# Patient Record
Sex: Male | Born: 1995 | Race: Black or African American | Hispanic: No | Marital: Single | Smoking: Current every day smoker
Health system: Southern US, Community
[De-identification: ages and names within clinical notes are randomized; demographics above are authoritative.]

---
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
## 2018-03-16 ENCOUNTER — Emergency Department (HOSPITAL_COMMUNITY)
Admission: EM | Admit: 2018-03-16 | Discharge: 2018-03-16 | Disposition: A | Discharge status: Home / Self Care | Payer: Self-pay | Attending: Emergency Medicine | Admitting: Emergency Medicine

## 2018-03-16 ENCOUNTER — Encounter (HOSPITAL_COMMUNITY): Payer: Self-pay

## 2018-03-16 ENCOUNTER — Other Ambulatory Visit: Payer: Self-pay

## 2018-03-16 DIAGNOSIS — X500XXA Overexertion from strenuous movement or load, initial encounter: Secondary | ICD-10-CM | POA: Insufficient documentation

## 2018-03-16 DIAGNOSIS — Y9389 Activity, other specified: Secondary | ICD-10-CM | POA: Insufficient documentation

## 2018-03-16 DIAGNOSIS — S29019A Strain of muscle and tendon of unspecified wall of thorax, initial encounter: Secondary | ICD-10-CM | POA: Insufficient documentation

## 2018-03-16 DIAGNOSIS — F17210 Nicotine dependence, cigarettes, uncomplicated: Secondary | ICD-10-CM | POA: Insufficient documentation

## 2018-03-16 DIAGNOSIS — Y929 Unspecified place or not applicable: Secondary | ICD-10-CM | POA: Insufficient documentation

## 2018-03-16 DIAGNOSIS — Y999 Unspecified external cause status: Secondary | ICD-10-CM | POA: Insufficient documentation

## 2018-03-16 MED ORDER — CYCLOBENZAPRINE HCL 10 MG PO TABS: 10.0000 mg | ORAL_TABLET | Freq: Two times a day (BID) | ORAL | 0 refills | Status: DC | PRN

## 2018-03-16 MED ORDER — NAPROXEN 500 MG PO TABS: 500.0000 mg | ORAL_TABLET | Freq: Two times a day (BID) | ORAL | 0 refills | Status: DC

## 2018-03-16 NOTE — ED Provider Notes (Signed)
[facility]-EMERGENCY DEPT Provider Note   CSN: [id number] Arrival date & time: 03/16/18  1540     History   Chief Complaint Chief Complaint  Patient presents with  . Back Pain    HPI Louis Carson is a 23 y.o. male who presents to the ED with back pain. Patient reports the pain is in the left thoracic area and started 2 days ago. Patient reports lifting heavy furniture and the pain started after that. No loss of control of bladder or bowels.   The history is provided by the patient. No language interpreter was used.  Back Pain  Location:  Thoracic spine Quality:  Aching Radiates to:  Does not radiate Onset quality:  Sudden Duration:  2 days Timing:  Constant Chronicity:  New Context: lifting heavy objects   Relieved by:  None tried Worsened by:  Twisting and movement Ineffective treatments:  None tried Associated symptoms: no abdominal pain, no bladder incontinence, no bowel incontinence, no dysuria, no fever, no headaches, no leg pain and no weakness     History reviewed. No pertinent past medical history.  There are no active problems to display for this patient.   History reviewed. No pertinent surgical history.      Home Medications    Prior to Admission medications   Medication Sig Start Date End Date Taking? Authorizing Provider  cyclobenzaprine (FLEXERIL) 10 MG tablet Take 1 tablet (10 mg total) by mouth 2 (two) times daily as needed for muscle spasms. 03/16/18   Janne NapoleonNeese, Armel Rabbani M, NP  naproxen (NAPROSYN) 500 MG tablet Take 1 tablet (500 mg total) by mouth 2 (two) times daily. 03/16/18   Janne NapoleonNeese, Vondra Aldredge M, NP    Family History Family History  Problem Relation Age of Onset  . Healthy Mother   . Healthy Father     Social History Social History   Tobacco Use  . Smoking status: Current Every Day Smoker    Types: Cigars  . Smokeless tobacco: Never Used  Substance Use Topics  . Alcohol use: Never    Frequency: Never  . Drug use:  Never     Allergies   Patient has no known allergies.   Review of Systems Review of Systems  Constitutional: Negative for chills and fever.  HENT: Negative.   Eyes: Negative for visual disturbance.  Respiratory: Negative for shortness of breath.   Gastrointestinal: Negative for abdominal pain, bowel incontinence, nausea and vomiting.  Genitourinary: Negative for bladder incontinence and dysuria.  Musculoskeletal: Positive for back pain.  Skin: Negative for rash.  Neurological: Negative for weakness and headaches.  Psychiatric/Behavioral: Negative for confusion.     Physical Exam Updated Vital Signs BP 131/77 (BP Location: Right Arm)   Pulse 68   Temp 97.7 F (36.5 C) (Oral)   Resp 16   Ht 5\' 8"  (1.727 m)   Wt 61.2 kg   SpO2 100%   BMI 20.53 kg/m   Physical Exam Vitals signs and nursing note reviewed.  Constitutional:      General: He is not in acute distress.    Appearance: He is well-developed.  HENT:     Head: Normocephalic and atraumatic.     Nose: Nose normal.     Mouth/Throat:     Mouth: Mucous membranes are moist.  Eyes:     Conjunctiva/sclera: Conjunctivae normal.  Neck:     Musculoskeletal: Neck supple.  Cardiovascular:     Rate and Rhythm: Normal rate.  Pulmonary:     Effort:  Pulmonary effort is normal.  Musculoskeletal:     Thoracic back: He exhibits tenderness and spasm. He exhibits normal pulse. Decreased range of motion: due to pain.       Back:  Skin:    General: Skin is warm and dry.  Neurological:     Mental Status: He is alert and oriented to person, place, and time.     Motor: Motor function is intact.     Deep Tendon Reflexes:     Reflex Scores:      Bicep reflexes are 2+ on the right side and 2+ on the left side.      Brachioradialis reflexes are 2+ on the right side and 2+ on the left side.      Patellar reflexes are 2+ on the right side and 2+ on the left side.    Comments: Grips are equal. Straight leg raises without  difficulty.  Psychiatric:        Mood and Affect: Mood normal.      ED Treatments / Results  Labs (all labs ordered are listed, but only abnormal results are displayed) Labs Reviewed - No data to display  Radiology No results found.  Procedures Procedures (including critical care time)  Medications Ordered in ED Medications - No data to display   Initial Impression / Assessment and Plan / ED Course  I have reviewed the triage vital signs and the nursing notes. Patient with back pain.  No neurological deficits and normal neuro exam.  Patient can walk but states is painful.  No loss of bowel or bladder control.  No concern for cauda equina.  No fever, night sweats, weight loss, h/o cancer, IVDU.  RICE protocol and pain medicine indicated and discussed with patient.   Final Clinical Impressions(s) / ED Diagnoses   Final diagnoses:  Thoracic myofascial strain, initial encounter    ED Discharge Orders         Ordered    cyclobenzaprine (FLEXERIL) 10 MG tablet  2 times daily PRN     03/16/18 1755    naproxen (NAPROSYN) 500 MG tablet  2 times daily     03/16/18 1755           Kerrie Buffalo [place], [place] 03/16/18 2127    Linwood Dibbles, MD 03/18/18 1319

## 2018-03-16 NOTE — Discharge Instructions (Addendum)
Do not take the muscle relaxer if driving or working as it will make you sleepy. Follow up with your doctor or return here as needed.

## 2018-03-16 NOTE — ED Notes (Signed)
Pt called X2 for triage with no responce

## 2018-03-16 NOTE — ED Triage Notes (Signed)
Patient c/o left mid back pain x 2 days. Patient states he has been lifting furniture and noted that the pain started after that.

## 2018-04-10 ENCOUNTER — Other Ambulatory Visit: Payer: Self-pay

## 2018-04-10 ENCOUNTER — Emergency Department (HOSPITAL_COMMUNITY)
Admission: EM | Admit: 2018-04-10 | Discharge: 2018-04-10 | Disposition: A | Discharge status: Home / Self Care | Payer: Self-pay | Attending: Emergency Medicine | Admitting: Emergency Medicine

## 2018-04-10 ENCOUNTER — Encounter (HOSPITAL_COMMUNITY): Payer: Self-pay | Admitting: Emergency Medicine

## 2018-04-10 DIAGNOSIS — J40 Bronchitis, not specified as acute or chronic: Secondary | ICD-10-CM | POA: Insufficient documentation

## 2018-04-10 DIAGNOSIS — F17290 Nicotine dependence, other tobacco product, uncomplicated: Secondary | ICD-10-CM | POA: Insufficient documentation

## 2018-04-10 DIAGNOSIS — R0981 Nasal congestion: Secondary | ICD-10-CM | POA: Insufficient documentation

## 2018-04-10 MED ORDER — AMOXICILLIN 500 MG PO CAPS: 500.0000 mg | ORAL_CAPSULE | Freq: Three times a day (TID) | ORAL | 0 refills | Status: DC

## 2018-04-10 NOTE — ED Triage Notes (Signed)
Pt reports for 2 weeks having cough, congestion and pains when coughs. Reports had family sick.

## 2018-04-10 NOTE — Discharge Instructions (Addendum)
Return if any problems.

## 2018-04-10 NOTE — ED Provider Notes (Signed)
[facility]-EMERGENCY DEPT Provider Note   CSN: [id number] Arrival date & time: 04/10/18  1555     History   Chief Complaint Chief Complaint  Patient presents with  . Cough  . Nasal Congestion    HPI Louis Carson is a 23 y.o. male.  The history is provided by the patient. No language interpreter was used.  Cough  Cough characteristics:  Productive Sputum characteristics:  Nondescript Severity:  Moderate Onset quality:  Gradual Duration:  2 weeks Timing:  Constant Progression:  Worsening Chronicity:  New Smoker: no   Relieved by:  Nothing Worsened by:  Nothing Ineffective treatments:  None tried Associated symptoms: sinus congestion     History reviewed. No pertinent past medical history.  There are no active problems to display for this patient.   History reviewed. No pertinent surgical history.      Home Medications    Prior to Admission medications   Medication Sig Start Date End Date Taking? Authorizing Provider  amoxicillin (AMOXIL) 500 MG capsule Take 1 capsule (500 mg total) by mouth 3 (three) times daily. 04/10/18   Elson Areas, PA-C  cyclobenzaprine (FLEXERIL) 10 MG tablet Take 1 tablet (10 mg total) by mouth 2 (two) times daily as needed for muscle spasms. 03/16/18   Janne Napoleon, NP  naproxen (NAPROSYN) 500 MG tablet Take 1 tablet (500 mg total) by mouth 2 (two) times daily. 03/16/18   Janne Napoleon, NP    Family History Family History  Problem Relation Age of Onset  . Healthy Mother   . Healthy Father     Social History Social History   Tobacco Use  . Smoking status: Current Every Day Smoker    Types: Cigars  . Smokeless tobacco: Never Used  Substance Use Topics  . Alcohol use: Never    Frequency: Never  . Drug use: Never     Allergies   Patient has no known allergies.   Review of Systems Review of Systems  Respiratory: Positive for cough.   All other systems reviewed and are  negative.    Physical Exam Updated Vital Signs BP 111/71 (BP Location: Right Arm)   Pulse 68   Temp 98.2 F (36.8 C) (Oral)   Resp 18   SpO2 99%   Physical Exam Vitals signs and nursing note reviewed.  Constitutional:      Appearance: He is well-developed.  HENT:     Head: Normocephalic.     Nose: Nose normal.     Mouth/Throat:     Mouth: Mucous membranes are moist.  Eyes:     Pupils: Pupils are equal, round, and reactive to light.  Neck:     Musculoskeletal: Normal range of motion.  Cardiovascular:     Rate and Rhythm: Normal rate.  Pulmonary:     Effort: Pulmonary effort is normal.  Abdominal:     General: Abdomen is flat. There is no distension.  Musculoskeletal: Normal range of motion.  Skin:    General: Skin is warm.     Capillary Refill: Capillary refill takes less than 2 seconds.  Neurological:     General: No focal deficit present.     Mental Status: He is alert and oriented to person, place, and time.  Psychiatric:        Mood and Affect: Mood normal.      ED Treatments / Results  Labs (all labs ordered are listed, but only abnormal results are displayed) Labs Reviewed - No data  to display  EKG None  Radiology No results found.  Procedures Procedures (including critical care time)  Medications Ordered in ED Medications - No data to display   Initial Impression / Assessment and Plan / ED Course  I have reviewed the triage vital signs and the nursing notes.  Pertinent labs & imaging results that were available during my care of the patient were reviewed by me and considered in my medical decision making (see chart for details).     MDM  Pt has had cough for over 2 weeks.  I will treat for bronchitis.  Final Clinical Impressions(s) / ED Diagnoses   Final diagnoses:  Bronchitis    ED Discharge Orders         Ordered    amoxicillin (AMOXIL) 500 MG capsule  3 times daily     04/10/18 1634        An After Visit Summary was printed  and given to the patient.    Osie Cheeks 04/10/18 2137    Linwood Dibbles, MD 04/10/18 [phone]

## 2018-04-18 ENCOUNTER — Emergency Department (HOSPITAL_COMMUNITY)
Admission: EM | Admit: 2018-04-18 | Discharge: 2018-04-18 | Disposition: A | Discharge status: Home / Self Care | Payer: Self-pay | Attending: Emergency Medicine | Admitting: Emergency Medicine

## 2018-04-18 ENCOUNTER — Other Ambulatory Visit: Payer: Self-pay

## 2018-04-18 ENCOUNTER — Encounter (HOSPITAL_COMMUNITY): Payer: Self-pay | Admitting: Emergency Medicine

## 2018-04-18 DIAGNOSIS — R1013 Epigastric pain: Secondary | ICD-10-CM | POA: Insufficient documentation

## 2018-04-18 DIAGNOSIS — Z79899 Other long term (current) drug therapy: Secondary | ICD-10-CM | POA: Insufficient documentation

## 2018-04-18 DIAGNOSIS — K2970 Gastritis, unspecified, without bleeding: Secondary | ICD-10-CM | POA: Insufficient documentation

## 2018-04-18 DIAGNOSIS — R112 Nausea with vomiting, unspecified: Secondary | ICD-10-CM

## 2018-04-18 DIAGNOSIS — F17210 Nicotine dependence, cigarettes, uncomplicated: Secondary | ICD-10-CM | POA: Insufficient documentation

## 2018-04-18 LAB — COMPREHENSIVE METABOLIC PANEL
ALT: 18 U/L (ref 0–44)
AST: 20 U/L (ref 15–41)
Albumin: 4.9 g/dL (ref 3.5–5.0)
Alkaline Phosphatase: 73 U/L (ref 38–126)
Anion gap: 6 (ref 5–15)
BUN: 15 mg/dL (ref 6–20)
CHLORIDE: 104 mmol/L (ref 98–111)
CO2: 28 mmol/L (ref 22–32)
Calcium: 9.4 mg/dL (ref 8.9–10.3)
Creatinine, Ser: 0.94 mg/dL (ref 0.61–1.24)
GFR calc Af Amer: >60 mL/min (ref >60)
GFR calc non Af Amer: >60 mL/min (ref >60)
Glucose, Bld: 81 mg/dL (ref 70–99)
POTASSIUM: 3.9 mmol/L (ref 3.5–5.1)
Sodium: 138 mmol/L (ref 135–145)
Total Bilirubin: 1.5 mg/dL — ABNORMAL HIGH (ref 0.3–1.2)
Total Protein: 7.8 g/dL (ref 6.5–8.1)

## 2018-04-18 LAB — CBC
HCT: 47 % (ref 39.0–52.0)
HEMOGLOBIN: 15.1 g/dL (ref 13.0–17.0)
MCH: 30.3 pg (ref 26.0–34.0)
MCHC: 32.1 g/dL (ref 30.0–36.0)
MCV: 94.4 fL (ref 80.0–100.0)
Platelets: 248 10*3/uL (ref 150–400)
RBC: 4.98 MIL/uL (ref 4.22–5.81)
RDW: 11.8 % (ref 11.5–15.5)
WBC: 4.8 10*3/uL (ref 4.0–10.5)
nRBC: 0 % (ref 0.0–0.2)

## 2018-04-18 LAB — LIPASE, BLOOD: Lipase: 45 U/L (ref 11–51)

## 2018-04-18 MED ORDER — ALUM & MAG HYDROXIDE-SIMETH 200-200-20 MG/5ML PO SUSP
30.0000 mL | Freq: Once | ORAL | Status: AC
  Administered 2018-04-18: 30 mL via ORAL
  Filled 2018-04-18: qty 30

## 2018-04-18 MED ORDER — RANITIDINE HCL 150 MG PO TABS: 150.0000 mg | ORAL_TABLET | Freq: Two times a day (BID) | ORAL | 0 refills | Status: AC

## 2018-04-18 MED ORDER — ONDANSETRON HCL 4 MG/2ML IJ SOLN
4.0000 mg | Freq: Once | INTRAMUSCULAR | Status: AC
  Administered 2018-04-18: 4 mg via INTRAVENOUS
  Filled 2018-04-18: qty 2

## 2018-04-18 MED ORDER — ONDANSETRON 4 MG PO TBDP: 4.0000 mg | ORAL_TABLET | Freq: Three times a day (TID) | ORAL | 0 refills | Status: DC | PRN

## 2018-04-18 MED ORDER — SODIUM CHLORIDE 0.9 % IV BOLUS
1000.0000 mL | Freq: Once | INTRAVENOUS | Status: AC
  Administered 2018-04-18: 1000 mL via INTRAVENOUS

## 2018-04-18 MED ORDER — LIDOCAINE VISCOUS HCL 2 % MT SOLN
15.0000 mL | Freq: Once | OROMUCOSAL | Status: AC
  Administered 2018-04-18: 15 mL via ORAL
  Filled 2018-04-18: qty 15

## 2018-04-18 MED ORDER — FAMOTIDINE IN NACL 20-0.9 MG/50ML-% IV SOLN
20.0000 mg | Freq: Once | INTRAVENOUS | Status: AC
  Administered 2018-04-18: 20 mg via INTRAVENOUS
  Filled 2018-04-18: qty 50

## 2018-04-18 NOTE — Discharge Instructions (Signed)
Your abdominal pain is likely from gastritis or an ulcer, probably from what you ate yesterday. You will need to take zantac as directed, and avoid spicy/fatty/acidic foods, avoid soda/coffee/tea/alcohol. Avoid laying down flat within 30 minutes of eating. Avoid NSAIDs like ibuprofen/aleve/motrin/etc on an empty stomach. May consider using over the counter tums/maalox as needed for additional relief. Use zofran as directed as needed for nausea. Use tylenol as needed for pain. Follow up with your regular doctor in 5-7 days for recheck of symptoms. Return to the ER for changes or worsening symptoms.  Abdominal (belly) pain can be caused by many things. Your caregiver performed an examination and possibly ordered blood/urine tests and imaging (CT scan, x-rays, ultrasound). Many cases can be observed and treated at home after initial evaluation in the emergency department. Even though you are being discharged home, abdominal pain can be unpredictable. Therefore, you need a repeated exam if your pain does not resolve, returns, or worsens. Most patients with abdominal pain don't have to be admitted to the hospital or have surgery, but serious problems like appendicitis and gallbladder attacks can start out as nonspecific pain. Many abdominal conditions cannot be diagnosed in one visit, so follow-up evaluations are very important. SEEK IMMEDIATE MEDICAL ATTENTION IF YOU DEVELOP ANY OF THE FOLLOWING SYMPTOMS: The pain does not go away or becomes severe.  A temperature above 101 develops.  Repeated vomiting occurs (multiple episodes).  The pain becomes localized to portions of the abdomen. The right side could possibly be appendicitis. In an adult, the left lower portion of the abdomen could be colitis or diverticulitis.  Blood is being passed in stools or vomit (bright red or black tarry stools).  Return also if you develop chest pain, difficulty breathing, dizziness or fainting, or become confused, poorly  responsive, or inconsolable (young children). The constipation stays for more than 4 days.  There is belly (abdominal) or rectal pain.  You do not seem to be getting better.

## 2018-04-18 NOTE — ED Triage Notes (Signed)
Pt reports this morning started having abd pains with n/v and had to leave work. Denies pain at this time or issues with bowels or urination.

## 2018-04-18 NOTE — ED Notes (Signed)
Pt refused blood draw

## 2018-04-18 NOTE — ED Provider Notes (Signed)
[facility]-EMERGENCY DEPT Provider Note   CSN: [id number] Arrival date & time: 04/18/18  1531    History   Chief Complaint Chief Complaint  Patient presents with  . Abdominal Pain  . vomit    HPI    Louis Carson is a 23 y.o. otherwise healthy male who presents to the ED with complaints of abdominal pain that began around 3 AM that has since resolved with associated nausea and vomiting.  Patient states that he ate [place] food last night before bed for the first time ever, reports that it was spicy.  Around 3 AM he woke up with some epigastric abdominal pain that he describes as 6/10 intermittent sharp nonradiating epigastric pain that had no known aggravating or alleviating factors, no treatments were tried.  He woke up around 7 AM and still had some mild pain and then vomited, the pain went away after that but he vomited once more, and while at work he did not feel well so he decided to come in for evaluation.  He had a total of 2 episodes of nonbloody nonbilious emesis.  He drank alcohol 2 days ago but none yesterday. He reports that he enjoys spicy food regularly.  He denies any fevers, chills, chest pain, shortness of breath, hematemesis, diarrhea, constipation, melena, hematochezia, dysuria, hematuria, myalgias, arthralgias, numbness, tingling, focal weakness, or any other complaints at this time.  He denies any recent travel, sick contacts, NSAID use, or prior abdominal surgeries.  He denies having any medical problems.  The history is provided by the patient and medical records. No language interpreter was used.    History reviewed. No pertinent past medical history.  There are no active problems to display for this patient.   History reviewed. No pertinent surgical history.      Home Medications    Prior to Admission medications   Medication Sig Start Date End Date Taking? Authorizing Provider  amoxicillin (AMOXIL) 500 MG capsule Take 1 capsule  (500 mg total) by mouth 3 (three) times daily. 04/10/18   Elson Areas, PA-C  cyclobenzaprine (FLEXERIL) 10 MG tablet Take 1 tablet (10 mg total) by mouth 2 (two) times daily as needed for muscle spasms. 03/16/18   Janne Napoleon, NP  naproxen (NAPROSYN) 500 MG tablet Take 1 tablet (500 mg total) by mouth 2 (two) times daily. 03/16/18   Janne Napoleon, NP    Family History Family History  Problem Relation Age of Onset  . Healthy Mother   . Healthy Father     Social History Social History   Tobacco Use  . Smoking status: Current Every Day Smoker    Types: Cigars  . Smokeless tobacco: Never Used  Substance Use Topics  . Alcohol use: Never    Frequency: Never  . Drug use: Never     Allergies   Patient has no known allergies.   Review of Systems Review of Systems  Constitutional: Negative for chills and fever.  Respiratory: Negative for shortness of breath.   Cardiovascular: Negative for chest pain.  Gastrointestinal: Positive for abdominal pain, nausea and vomiting. Negative for blood in stool, constipation and diarrhea.  Genitourinary: Negative for dysuria and hematuria.  Musculoskeletal: Negative for arthralgias and myalgias.  Skin: Negative for color change.  Allergic/Immunologic: Negative for immunocompromised state.  Neurological: Negative for weakness and numbness.  Psychiatric/Behavioral: Negative for confusion.   All other systems reviewed and are negative for acute change except as noted in the HPI.  Physical Exam Updated Vital Signs BP 121/81 (BP Location: Left Arm)   Pulse 82   Temp 98.7 F (37.1 C) (Oral)   Resp 18   SpO2 100%   Physical Exam Vitals signs and nursing note reviewed.  Constitutional:      General: He is not in acute distress.    Appearance: Normal appearance. He is well-developed. He is not toxic-appearing.     Comments: Afebrile, nontoxic, NAD  HENT:     Head: Normocephalic and atraumatic.  Eyes:     General:        Right eye:  No discharge.        Left eye: No discharge.     Conjunctiva/sclera: Conjunctivae normal.  Neck:     Musculoskeletal: Normal range of motion and neck supple.  Cardiovascular:     Rate and Rhythm: Normal rate and regular rhythm.     Pulses: Normal pulses.     Heart sounds: Normal heart sounds, S1 normal and S2 normal. No murmur. No friction rub. No gallop.   Pulmonary:     Effort: Pulmonary effort is normal. No respiratory distress.     Breath sounds: Normal breath sounds. No decreased breath sounds, wheezing, rhonchi or rales.  Abdominal:     General: Bowel sounds are normal. There is no distension.     Palpations: Abdomen is soft. Abdomen is not rigid.     Tenderness: There is abdominal tenderness in the epigastric area. There is no right CVA tenderness, left CVA tenderness, guarding or rebound. Negative signs include Murphy's sign and McBurney's sign.     Comments: Soft, nondistended, +BS throughout, with very mild epigastric TTP, no r/g/r, neg murphy's, neg mcburney's, no CVA TTP   Musculoskeletal: Normal range of motion.  Skin:    General: Skin is warm and dry.     Findings: No rash.  Neurological:     Mental Status: He is alert and oriented to person, place, and time.     Sensory: Sensation is intact. No sensory deficit.     Motor: Motor function is intact.  Psychiatric:        Mood and Affect: Mood and affect normal.        Behavior: Behavior normal.      ED Treatments / Results  Labs (all labs ordered are listed, but only abnormal results are displayed) Labs Reviewed  COMPREHENSIVE METABOLIC PANEL - Abnormal; Notable for the following components:      Result Value   Total Bilirubin 1.5 (*)    All other components within normal limits  LIPASE, BLOOD  CBC    EKG None  Radiology No results found.  Procedures Procedures (including critical care time)  Medications Ordered in ED Medications  sodium chloride 0.9 % bolus 1,000 mL (0 mLs Intravenous Stopped  04/18/18 1819)  ondansetron (ZOFRAN) injection 4 mg (4 mg Intravenous Given 04/18/18 1724)  famotidine (PEPCID) IVPB 20 mg premix (0 mg Intravenous Stopped 04/18/18 1754)  alum & mag hydroxide-simeth (MAALOX/MYLANTA) 200-200-20 MG/5ML suspension 30 mL (30 mLs Oral Given 04/18/18 1716)    And  lidocaine (XYLOCAINE) 2 % viscous mouth solution 15 mL (15 mLs Oral Given 04/18/18 1716)     Initial Impression / Assessment and Plan / ED Course  I have reviewed the triage vital signs and the nursing notes.  Pertinent labs & imaging results that were available during my care of the patient were reviewed by me and considered in my medical decision making (see chart for details).  23 y.o. male here with upper abdominal pain that began around 3 AM but has since resolved as well as nausea and vomiting.  He ate [place] food for the first time last night and states it was very spicy.  On exam, very mild midepigastric abdominal tenderness with no rebound or guarding, no rigidity, negative Murphy sign, no other areas of tenderness.  Overall well-appearing.  Will get labs but doubt need for imaging or urinalysis (no UTI symptoms or lower abd pain), will give fluids, Zofran, Pepcid, GI cocktail, and reassess shortly.   7:14 PM CBC WNL. CMP with marginally elevated Tbili 1.5 but otherwise unremarkable, no other LFT abnormalities, likely from mild dehydration/vomiting, doubt need for further emergent work up. Lipase WNL. Pt feeling better and tolerating PO well. Suspect gastritis from [place] food last night. Discussed diet/lifestyle modifications for symptoms, will start on zantac/zofran, advised tylenol and avoidance/sparing use of NSAIDs only on full stomach, discussed other OTC remedies for symptomatic relief, and f/up with PCP in 5-7 days for recheck of symptoms and ongoing evaluation/management. I explained the diagnosis and have given explicit precautions to return to the ER including for any other new or  worsening symptoms. The patient understands and accepts the medical plan as it's been dictated and I have answered their questions. Discharge instructions concerning home care and prescriptions have been given. The patient is STABLE and is discharged to home in good condition.    Final Clinical Impressions(s) / ED Diagnoses   Final diagnoses:  Epigastric abdominal pain  Nausea and vomiting in adult patient  Gastritis, presence of bleeding unspecified, unspecified chronicity, unspecified gastritis type    ED Discharge Orders         Ordered    ondansetron (ZOFRAN ODT) 4 MG disintegrating tablet  Every 8 hours PRN     04/18/18 1913    ranitidine (ZANTAC) 150 MG tablet  2 times daily     04/18/18 [address] [place], [place] 04/18/18 1914    Shaune Pollack, MD 04/22/18 0002

## 2018-06-21 ENCOUNTER — Encounter (HOSPITAL_COMMUNITY): Payer: Self-pay

## 2018-06-21 ENCOUNTER — Emergency Department (HOSPITAL_COMMUNITY)
Admission: EM | Admit: 2018-06-21 | Discharge: 2018-06-21 | Disposition: A | Discharge status: Home / Self Care | Payer: Self-pay | Attending: Emergency Medicine | Admitting: Emergency Medicine

## 2018-06-21 ENCOUNTER — Other Ambulatory Visit: Payer: Self-pay

## 2018-06-21 DIAGNOSIS — J3489 Other specified disorders of nose and nasal sinuses: Secondary | ICD-10-CM | POA: Insufficient documentation

## 2018-06-21 DIAGNOSIS — Z7189 Other specified counseling: Secondary | ICD-10-CM | POA: Insufficient documentation

## 2018-06-21 DIAGNOSIS — R509 Fever, unspecified: Secondary | ICD-10-CM | POA: Insufficient documentation

## 2018-06-21 DIAGNOSIS — R5383 Other fatigue: Secondary | ICD-10-CM | POA: Insufficient documentation

## 2018-06-21 DIAGNOSIS — Z79899 Other long term (current) drug therapy: Secondary | ICD-10-CM | POA: Insufficient documentation

## 2018-06-21 DIAGNOSIS — F17290 Nicotine dependence, other tobacco product, uncomplicated: Secondary | ICD-10-CM | POA: Insufficient documentation

## 2018-06-21 NOTE — ED Triage Notes (Signed)
States works at [employer] and lot of people have tested positive for Covid 19 and now states tired, and possible fever he has not tested for fever. And runny nose.

## 2018-06-21 NOTE — ED Provider Notes (Signed)
[facility]-EMERGENCY DEPT Provider Note   CSN: [id number] Arrival date & time: 06/21/18  1532    History   Chief Complaint Chief Complaint  Patient presents with  . Influenza    HPI    Louis Carson is a 23 y.o. male who presents to the ED with complaints of 1 day of fatigue, subjective fever, and rhinorrhea that started while he was at work.  No known aggravating or alleviating factors, no treatments tried prior to arrival.  Symptoms are mild and intermittent.  Several people have tested positive for COVID-19 at work.  He is a non-smoker.  He has not checked his temperature, his temperature is checked at work, but he did not start feeling bad until after he left work yesterday.  He denies any sore throat, cough, ear pain or drainage, loss of taste or smell, chills, chest pain, shortness breath, abdominal pain, nausea, vomiting, diarrhea, constipation, dysuria, hematuria, all disc arthralgias, numbness, tingling, focal weakness, or any other complaints at this time.  The history is provided by the patient and medical records. No language interpreter was used.  Influenza  Presenting symptoms: fatigue, fever (subjective) and rhinorrhea   Presenting symptoms: no cough, no diarrhea, no myalgias, no nausea, no shortness of breath, no sore throat and no vomiting   Associated symptoms: no chills and no ear pain     History reviewed. No pertinent past medical history.  There are no active problems to display for this patient.   History reviewed. No pertinent surgical history.      Home Medications    Prior to Admission medications   Medication Sig Start Date End Date Taking? Authorizing Provider  amoxicillin (AMOXIL) 500 MG capsule Take 1 capsule (500 mg total) by mouth 3 (three) times daily. Patient not taking: Reported on 04/18/2018 04/10/18   Elson Areas, PA-C  cyclobenzaprine (FLEXERIL) 10 MG tablet Take 1 tablet (10 mg total) by mouth 2 (two) times  daily as needed for muscle spasms. Patient not taking: Reported on 04/18/2018 03/16/18   Janne Napoleon, NP  DM-Phenylephrine-Acetaminophen [facility]) 10-5-325 MG/15ML LIQD Take 15 mLs by mouth every 6 (six) hours as needed (cough, headache, and congestion).    [provider]  naproxen (NAPROSYN) 500 MG tablet Take 1 tablet (500 mg total) by mouth 2 (two) times daily. Patient not taking: Reported on 04/18/2018 03/16/18   Janne Napoleon, NP  ondansetron (ZOFRAN ODT) 4 MG disintegrating tablet Take 1 tablet (4 mg total) by mouth every 8 (eight) hours as needed for nausea or vomiting. 04/18/18   Ahilyn Nell, [place], PA-C  ranitidine (ZANTAC) 150 MG tablet Take 1 tablet (150 mg total) by mouth 2 (two) times daily. 04/18/18   Jaquarius Seder, [place], PA-C    Family History Family History  Problem Relation Age of Onset  . Healthy Mother   . Healthy Father     Social History Social History   Tobacco Use  . Smoking status: Current Every Day Smoker    Types: Cigars  . Smokeless tobacco: Never Used  Substance Use Topics  . Alcohol use: Never    Frequency: Never  . Drug use: Never     Allergies   Patient has no known allergies.   Review of Systems Review of Systems  Constitutional: Positive for fatigue and fever (subjective). Negative for chills.  HENT: Positive for rhinorrhea. Negative for ear discharge, ear pain and sore throat.   Respiratory: Negative for cough and shortness of breath.  Cardiovascular: Negative for chest pain.  Gastrointestinal: Negative for abdominal pain, constipation, diarrhea, nausea and vomiting.  Genitourinary: Negative for dysuria and hematuria.  Musculoskeletal: Negative for arthralgias and myalgias.  Skin: Negative for color change.  Allergic/Immunologic: Negative for immunocompromised state.  Neurological: Negative for weakness and numbness.  Psychiatric/Behavioral: Negative for confusion.   All other systems reviewed and are negative for acute  change except as noted in the HPI.    Physical Exam Updated Vital Signs BP 131/79 (BP Location: Right Arm)   Pulse 70   Temp 98.2 F (36.8 C) (Oral)   Resp 18   Ht 5\' 8"  (1.727 m)   Wt 61.2 kg   SpO2 100%   BMI 20.53 kg/m   Physical Exam Vitals signs and nursing note reviewed.  Constitutional:      General: He is not in acute distress.    Appearance: Normal appearance. He is well-developed. He is not toxic-appearing.     Comments: Afebrile, nontoxic, NAD  HENT:     Head: Normocephalic and atraumatic.     Nose: Rhinorrhea present.     Mouth/Throat:     Mouth: Mucous membranes are moist.     Pharynx: Oropharynx is clear. Uvula midline. No pharyngeal swelling, oropharyngeal exudate, posterior oropharyngeal erythema or uvula swelling.     Tonsils: No tonsillar exudate or tonsillar abscesses.     Comments: Mild rhinorrhea. Oropharynx clear and moist, without uvular swelling or deviation, no trismus or drooling, no tonsillar swelling or erythema, no exudates.   Eyes:     General:        Right eye: No discharge.        Left eye: No discharge.     Conjunctiva/sclera: Conjunctivae normal.  Neck:     Musculoskeletal: Normal range of motion and neck supple.  Cardiovascular:     Rate and Rhythm: Normal rate.     Pulses: Normal pulses.  Pulmonary:     Effort: Pulmonary effort is normal. No respiratory distress.     Breath sounds: Normal breath sounds and air entry. No decreased breath sounds, wheezing, rhonchi or rales.     Comments: CTAB in all lung fields, no w/r/r, no hypoxia or increased WOB, speaking in full sentences, SpO2 100% on RA  Abdominal:     General: There is no distension.  Musculoskeletal: Normal range of motion.  Skin:    General: Skin is warm and dry.     Findings: No rash.  Neurological:     Mental Status: He is alert and oriented to person, place, and time.     Sensory: Sensation is intact. No sensory deficit.     Motor: Motor function is intact.   Psychiatric:        Mood and Affect: Mood and affect normal.        Behavior: Behavior normal.      ED Treatments / Results  Labs (all labs ordered are listed, but only abnormal results are displayed) Labs Reviewed - No data to display  EKG None  Radiology No results found.  Procedures Procedures (including critical care time)  Medications Ordered in ED Medications - No data to display   Initial Impression / Assessment and Plan / ED Course  I have reviewed the triage vital signs and the nursing notes.  Pertinent labs & imaging results that were available during my care of the patient were reviewed by me and considered in my medical decision making (see chart for details).  23 y.o. male here with rhinorrhea and fatigue with subjective fevers since yesterday. On exam, mild rhinorrhea, clear lung exam. Afebrile and nontoxic. Doubt need for further emergent work up, doubt need for COVID19 testing. Advised self quarantine, and OTC remedies for symptomatic relief. F/up with PCP in 1wk for recheck. I explained the diagnosis and have given explicit precautions to return to the ER including for any other new or worsening symptoms. The patient understands and accepts the medical plan as it's been dictated and I have answered their questions. Discharge instructions concerning home care and prescriptions have been given. The patient is STABLE and is discharged to home in good condition.   Jeyson Oland was evaluated in Emergency Department on 06/21/2018 for the symptoms described in the history of present illness. He was evaluated in the context of the global COVID-19 pandemic, which necessitated consideration that the patient might be at risk for infection with the SARS-CoV-2 virus that causes COVID-19. Institutional protocols and algorithms that pertain to the evaluation of patients at risk for COVID-19 are in a state of rapid change based on information released by regulatory  bodies including the CDC and federal and state organizations. These policies and algorithms were followed during the patient's care in the ED.    Final Clinical Impressions(s) / ED Diagnoses   Final diagnoses:  Rhinorrhea  Fatigue, unspecified type  Educated About Covid-19 Virus Infection    ED Discharge Orders    [address] [place], [place] 06/21/18 1626    Terrilee Files, MD 06/22/18 1147

## 2018-06-21 NOTE — ED Notes (Signed)
Bed: WTR5 Expected date:  Expected time:  Means of arrival:  Comments: 

## 2018-06-21 NOTE — Discharge Instructions (Addendum)
Your symptoms could be from the novel coronavirus, but we are not currently testing patients unless it's indicated. Please self quarantine for at least a week from symptom onset and 3 days fever free without medications, whichever is longer. Continue to stay well-hydrated. Gargle warm salt water and spit it out and use chloraseptic spray as needed for sore throat. Continue to alternate between Tylenol and Ibuprofen for pain or fever. Use Mucinex/Robitussin/etc for cough suppression/expectoration of mucus. Use over the counter flonase and the netipot to help with nasal congestion. May consider over-the-counter Benadryl or other antihistamine like Claritin/Zyrtec/etc to decrease secretions and for help with your symptoms. Follow up with your primary care doctor in 5-7 days for recheck of ongoing symptoms. Return to emergency department for emergent changing or worsening of symptoms.

## 2018-07-05 ENCOUNTER — Encounter (HOSPITAL_COMMUNITY): Payer: Self-pay | Admitting: Emergency Medicine

## 2018-07-05 ENCOUNTER — Other Ambulatory Visit: Payer: Self-pay

## 2018-07-05 ENCOUNTER — Emergency Department (HOSPITAL_COMMUNITY)
Admission: EM | Admit: 2018-07-05 | Discharge: 2018-07-05 | Disposition: A | Discharge status: Home / Self Care | Payer: Self-pay | Attending: Emergency Medicine | Admitting: Emergency Medicine

## 2018-07-05 DIAGNOSIS — F17210 Nicotine dependence, cigarettes, uncomplicated: Secondary | ICD-10-CM | POA: Insufficient documentation

## 2018-07-05 DIAGNOSIS — X500XXA Overexertion from strenuous movement or load, initial encounter: Secondary | ICD-10-CM | POA: Insufficient documentation

## 2018-07-05 DIAGNOSIS — T148XXA Other injury of unspecified body region, initial encounter: Secondary | ICD-10-CM

## 2018-07-05 DIAGNOSIS — Y929 Unspecified place or not applicable: Secondary | ICD-10-CM | POA: Insufficient documentation

## 2018-07-05 DIAGNOSIS — Y9389 Activity, other specified: Secondary | ICD-10-CM | POA: Insufficient documentation

## 2018-07-05 DIAGNOSIS — S39012A Strain of muscle, fascia and tendon of lower back, initial encounter: Secondary | ICD-10-CM | POA: Insufficient documentation

## 2018-07-05 DIAGNOSIS — Y990 Civilian activity done for income or pay: Secondary | ICD-10-CM | POA: Insufficient documentation

## 2018-07-05 DIAGNOSIS — Z79899 Other long term (current) drug therapy: Secondary | ICD-10-CM | POA: Insufficient documentation

## 2018-07-05 MED ORDER — MELOXICAM 15 MG PO TABS: 15.0000 mg | ORAL_TABLET | Freq: Every day | ORAL | 0 refills | Status: AC | PRN

## 2018-07-05 NOTE — ED Triage Notes (Signed)
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

## 2018-07-06 NOTE — ED Provider Notes (Signed)
[facility]-EMER[facility] Provider Note   CSN: [id number] Arrival date & time: 07/05/18  2224    History   Chief Complaint Chief Complaint  Patient presents with  . Back Pain    HPI Louis Carson is a 23 y.o. male.     HPI   23 year old male with left mid back pain.  Patient works for a [employer].  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  Is been persistent since then.  Worse with movement.  Ache at rest.  Does not radiate.  No numbness or tingling.  Has not tried taking anything for his symptoms.  The began shortly before arrival.  History reviewed. No pertinent past medical history.  There are no active problems to display for this patient.   History reviewed. No pertinent surgical history.      Home Medications    Prior to Admission medications   Medication Sig Start Date End Date Taking? Authorizing Provider  amoxicillin (AMOXIL) 500 MG capsule Take 1 capsule (500 mg total) by mouth 3 (three) times daily. Patient not taking: Reported on 04/18/2018 04/10/18   Elson Areas, PA-C  cyclobenzaprine (FLEXERIL) 10 MG tablet Take 1 tablet (10 mg total) by mouth 2 (two) times daily as needed for muscle spasms. Patient not taking: Reported on 04/18/2018 03/16/18   Janne Napoleon, NP  DM-Phenylephrine-Acetaminophen [facility]) 10-5-325 MG/15ML LIQD Take 15 mLs by mouth every 6 (six) hours as needed (cough, headache, and congestion).    [provider]  meloxicam (MOBIC) 15 MG tablet Take 1 tablet (15 mg total) by mouth daily as needed for pain. 07/05/18   Raeford Razor, MD  naproxen (NAPROSYN) 500 MG tablet Take 1 tablet (500 mg total) by mouth 2 (two) times daily. Patient not taking: Reported on 04/18/2018 03/16/18   Janne Napoleon, NP  ondansetron (ZOFRAN ODT) 4 MG disintegrating tablet Take 1 tablet (4 mg total) by mouth every 8 (eight) hours as needed  for nausea or vomiting. 04/18/18   Street, [place], PA-C  ranitidine (ZANTAC) 150 MG tablet Take 1 tablet (150 mg total) by mouth 2 (two) times daily. 04/18/18   Street, [place], PA-C    Family History Family History  Problem Relation Age of Onset  . Healthy Mother   . Healthy Father     Social History Social History   Tobacco Use  . Smoking status: Current Every Day Smoker    Types: Cigars  . Smokeless tobacco: Never Used  Substance Use Topics  . Alcohol use: Never    Frequency: Never  . Drug use: Never     Allergies   Patient has no known allergies.   Review of Systems Review of Systems  All systems reviewed and negative, other than as noted in HPI.  Physical Exam Updated Vital Signs BP 129/79 (BP Location: Left Arm)   Pulse 96   Temp 98.5 F (36.9 C) (Oral)   Resp 14   Ht 5\' 8"  (1.727 m)   Wt 61.2 kg   SpO2 98%   BMI 20.53 kg/m   Physical Exam Vitals signs and nursing note reviewed.  Constitutional:      General: He is not in acute distress.    Appearance: He is well-developed.  HENT:     Head: Normocephalic and atraumatic.  Eyes:     General:        Right eye: No  discharge.        Left eye: No discharge.     Conjunctiva/sclera: Conjunctivae normal.  Neck:     Musculoskeletal: Neck supple.  Cardiovascular:     Rate and Rhythm: Normal rate and regular rhythm.     Heart sounds: Normal heart sounds. No murmur. No friction rub. No gallop.   Pulmonary:     Effort: Pulmonary effort is normal. No respiratory distress.     Breath sounds: Normal breath sounds.  Abdominal:     General: There is no distension.     Palpations: Abdomen is soft.     Tenderness: There is no abdominal tenderness.  Musculoskeletal:        General: No tenderness.     Comments: Is to palpation in the left mid to lower thoracic region off the midline.  No overlying skin changes.  No midline spinal tenderness.  Lungs are clear with symmetric breath sounds bilaterally.  Skin:     General: Skin is warm and dry.  Neurological:     Mental Status: He is alert.  Psychiatric:        Behavior: Behavior normal.        Thought Content: Thought content normal.      ED Treatments / Results  Labs (all labs ordered are listed, but only abnormal results are displayed) Labs Reviewed - No data to display  EKG None  Radiology No results found.  Procedures Procedures (including critical care time)  Medications Ordered in ED Medications - No data to display   Initial Impression / Assessment and Plan / ED Course  I have reviewed the triage vital signs and the nursing notes.  Pertinent labs & imaging results that were available during my care of the patient were reviewed by me and considered in my medical decision making (see chart for details).       23 year old male with symptoms consistent with muscular strain.  Nonfocal neuro exam.  No red flags noted.  Plan symptomatic treatment.  Work note provided.  Return precautions discussed.  Final Clinical Impressions(s) / ED Diagnoses   Final diagnoses:  Muscle strain    ED Discharge Orders         Ordered    meloxicam (MOBIC) 15 MG tablet  Daily PRN     07/05/18 2243           Raeford RazorKohut, Creedence Heiss, MD 07/06/18 2104

## 2018-07-25 ENCOUNTER — Other Ambulatory Visit: Payer: Self-pay

## 2018-07-25 ENCOUNTER — Emergency Department (HOSPITAL_COMMUNITY)
Admission: EM | Admit: 2018-07-25 | Discharge: 2018-07-25 | Disposition: A | Discharge status: Home / Self Care | Payer: Self-pay | Attending: Emergency Medicine | Admitting: Emergency Medicine

## 2018-07-25 ENCOUNTER — Emergency Department (HOSPITAL_COMMUNITY): Payer: Self-pay

## 2018-07-25 ENCOUNTER — Encounter (HOSPITAL_COMMUNITY): Payer: Self-pay | Admitting: Emergency Medicine

## 2018-07-25 DIAGNOSIS — S8392XA Sprain of unspecified site of left knee, initial encounter: Secondary | ICD-10-CM | POA: Insufficient documentation

## 2018-07-25 DIAGNOSIS — Z79899 Other long term (current) drug therapy: Secondary | ICD-10-CM | POA: Insufficient documentation

## 2018-07-25 DIAGNOSIS — Y990 Civilian activity done for income or pay: Secondary | ICD-10-CM | POA: Insufficient documentation

## 2018-07-25 DIAGNOSIS — F17290 Nicotine dependence, other tobacco product, uncomplicated: Secondary | ICD-10-CM | POA: Insufficient documentation

## 2018-07-25 DIAGNOSIS — Y9389 Activity, other specified: Secondary | ICD-10-CM | POA: Insufficient documentation

## 2018-07-25 DIAGNOSIS — W228XXA Striking against or struck by other objects, initial encounter: Secondary | ICD-10-CM | POA: Insufficient documentation

## 2018-07-25 DIAGNOSIS — Y9289 Other specified places as the place of occurrence of the external cause: Secondary | ICD-10-CM | POA: Insufficient documentation

## 2018-07-25 NOTE — ED Notes (Signed)
Bed: WA10 Expected date:  Expected time:  Means of arrival:  Comments: 

## 2018-07-25 NOTE — ED Triage Notes (Signed)
Pt reports was at work and hit his left knee yesterday. Having pains with it.

## 2018-07-25 NOTE — ED Provider Notes (Signed)
[facility]   CSN: [id number] Arrival date & time: 07/25/18  1356    History   Chief Complaint Chief Complaint  Patient presents with  . Knee Pain    HPI Louis Carson is a 23 y.o. male.     23 year old male presents with left injury that was sustained yesterday.  States that while at work he hit his knee against an object.  Complains of dull pain to his infrapatellar region.  Denies any trouble with ambulation.  Pain better with remaining still and no treatment used prior to arrival     History reviewed. No pertinent past medical history.  There are no active problems to display for this patient.   History reviewed. No pertinent surgical history.      Home Medications    Prior to Admission medications   Medication Sig Start Date End Date Taking? Authorizing Provider  amoxicillin (AMOXIL) 500 MG capsule Take 1 capsule (500 mg total) by mouth 3 (three) times daily. Patient not taking: Reported on 04/18/2018 04/10/18   Elson Areas, PA-C  cyclobenzaprine (FLEXERIL) 10 MG tablet Take 1 tablet (10 mg total) by mouth 2 (two) times daily as needed for muscle spasms. Patient not taking: Reported on 04/18/2018 03/16/18   Janne Napoleon, NP  DM-Phenylephrine-Acetaminophen [facility]) 10-5-325 MG/15ML LIQD Take 15 mLs by mouth every 6 (six) hours as needed (cough, headache, and congestion).    [provider]  meloxicam (MOBIC) 15 MG tablet Take 1 tablet (15 mg total) by mouth daily as needed for pain. 07/05/18   Raeford Razor, MD  naproxen (NAPROSYN) 500 MG tablet Take 1 tablet (500 mg total) by mouth 2 (two) times daily. Patient not taking: Reported on 04/18/2018 03/16/18   Janne Napoleon, NP  ondansetron (ZOFRAN ODT) 4 MG disintegrating tablet Take 1 tablet (4 mg total) by mouth every 8 (eight) hours as needed for nausea or vomiting. 04/18/18   Street, [place], PA-C  ranitidine (ZANTAC) 150 MG tablet Take 1  tablet (150 mg total) by mouth 2 (two) times daily. 04/18/18   Street, [place], PA-C    Family History Family History  Problem Relation Age of Onset  . Healthy Mother   . Healthy Father     Social History Social History   Tobacco Use  . Smoking status: Current Every Day Smoker    Types: Cigars  . Smokeless tobacco: Never Used  Substance Use Topics  . Alcohol use: Never    Frequency: Never  . Drug use: Never     Allergies   Patient has no known allergies.   Review of Systems Review of Systems  All other systems reviewed and are negative.    Physical Exam Updated Vital Signs BP 123/71   Pulse 72   Temp 98.8 F (37.1 C) (Oral)   Resp 17   SpO2 100%   Physical Exam Vitals signs and nursing note reviewed.  Constitutional:      Appearance: He is well-developed. He is not toxic-appearing.  HENT:     Head: Normocephalic and atraumatic.  Eyes:     Conjunctiva/sclera: Conjunctivae normal.     Pupils: Pupils are equal, round, and reactive to light.  Neck:     Musculoskeletal: Normal range of motion.  Cardiovascular:     Rate and Rhythm: Normal rate.  Pulmonary:     Effort: Pulmonary effort is normal.  Musculoskeletal:     Left knee: He exhibits normal range of motion, no  swelling and no effusion.       Legs:  Skin:    General: Skin is warm and dry.  Neurological:     Mental Status: He is alert and oriented to person, place, and time.      ED Treatments / Results  Labs (all labs ordered are listed, but only abnormal results are displayed) Labs Reviewed - No data to display  EKG None  Radiology Dg Knee Complete 4 Views Left  Result Date: 07/25/2018 CLINICAL DATA:  Injury yesterday with anterior knee pain EXAM: LEFT KNEE - COMPLETE 4+ VIEW COMPARISON:  None. FINDINGS: No evidence of fracture, dislocation, or joint effusion. No evidence of arthropathy or other focal bone abnormality. Soft tissues are unremarkable. IMPRESSION: Normal radiographs.  Electronically Signed   By: Paulina FusiMark  Shogry M.D.   On: 07/25/2018 14:41    Procedures Procedures (including critical care time)  Medications Ordered in ED Medications - No data to display   Initial Impression / Assessment and Plan / ED Course  I have reviewed the triage vital signs and the nursing notes.  Pertinent labs & imaging results that were available during my care of the patient were reviewed by me and considered in my medical decision making (see chart for details).        X-rays are negative.  Follow-up given  Final Clinical Impressions(s) / ED Diagnoses   Final diagnoses:  None    ED Discharge Orders    None       Lorre NickAllen, Aveer Bartow, MD 07/25/18 1502

## 2018-08-06 ENCOUNTER — Encounter (HOSPITAL_COMMUNITY): Payer: Self-pay

## 2018-08-06 ENCOUNTER — Emergency Department (HOSPITAL_COMMUNITY)
Admission: EM | Admit: 2018-08-06 | Discharge: 2018-08-06 | Disposition: A | Discharge status: Home / Self Care | Payer: Self-pay | Attending: Emergency Medicine | Admitting: Emergency Medicine

## 2018-08-06 DIAGNOSIS — S39012A Strain of muscle, fascia and tendon of lower back, initial encounter: Secondary | ICD-10-CM | POA: Insufficient documentation

## 2018-08-06 DIAGNOSIS — F17290 Nicotine dependence, other tobacco product, uncomplicated: Secondary | ICD-10-CM | POA: Insufficient documentation

## 2018-08-06 DIAGNOSIS — Y92019 Unspecified place in single-family (private) house as the place of occurrence of the external cause: Secondary | ICD-10-CM | POA: Insufficient documentation

## 2018-08-06 DIAGNOSIS — Y998 Other external cause status: Secondary | ICD-10-CM | POA: Insufficient documentation

## 2018-08-06 DIAGNOSIS — T148XXA Other injury of unspecified body region, initial encounter: Secondary | ICD-10-CM

## 2018-08-06 DIAGNOSIS — Z79899 Other long term (current) drug therapy: Secondary | ICD-10-CM | POA: Insufficient documentation

## 2018-08-06 DIAGNOSIS — Y9389 Activity, other specified: Secondary | ICD-10-CM | POA: Insufficient documentation

## 2018-08-06 DIAGNOSIS — X509XXA Other and unspecified overexertion or strenuous movements or postures, initial encounter: Secondary | ICD-10-CM | POA: Insufficient documentation

## 2018-08-06 MED ORDER — MELOXICAM 15 MG PO TABS: 15.0000 mg | ORAL_TABLET | Freq: Every day | ORAL | 0 refills | Status: AC

## 2018-08-06 NOTE — ED Notes (Signed)
Discharge instructions and work note given to patient

## 2018-08-06 NOTE — ED Provider Notes (Signed)
[facility]-EMERGENCY DEPT Provider Note   CSN: [id number] Arrival date & time: 08/06/18  1823    History   Chief Complaint Chief Complaint  Patient presents with   Back Pain    HPI Louis Carson is a 23 y.o. male who presents to the ED complaining of sudden onset, constant, achy, left low back pain that began this morning. Pt states he was helping his friend move and was lifting a heavy table when he felt a sudden pain in his back. He then attempted to go to work but notes worsening pain with movement which brought him to the ED. Pt has not taken anything for the pain nor applied any ice or heat. He states he laid around in bed with no improvement. Denies fever, chills, saddle anesthesia, urinary retention, urinary or bowel incontinence, or any other associated symptoms. No hx IVDA. No hx chronic steroid use. No recent spinal manipulation.        History reviewed. No pertinent past medical history.  There are no active problems to display for this patient.   History reviewed. No pertinent surgical history.      Home Medications    Prior to Admission medications   Medication Sig Start Date End Date Taking? Authorizing Provider  amoxicillin (AMOXIL) 500 MG capsule Take 1 capsule (500 mg total) by mouth 3 (three) times daily. Patient not taking: Reported on 04/18/2018 04/10/18   Elson AreasSofia, Leslie K, PA-C  cyclobenzaprine (FLEXERIL) 10 MG tablet Take 1 tablet (10 mg total) by mouth 2 (two) times daily as needed for muscle spasms. Patient not taking: Reported on 04/18/2018 03/16/18   Janne NapoleonNeese, Hope M, NP  DM-Phenylephrine-Acetaminophen [facility](THERAFLU EXPRESSMAX) 10-5-325 MG/15ML LIQD Take 15 mLs by mouth every 6 (six) hours as needed (cough, headache, and congestion).    [provider]  meloxicam (MOBIC) 15 MG tablet Take 1 tablet (15 mg total) by mouth daily as needed for pain. 07/05/18   Raeford RazorKohut, Stephen, MD  meloxicam (MOBIC) 15 MG tablet Take 1 tablet (15 mg  total) by mouth daily for 7 days. 08/06/18 08/13/18  Hyman HopesVenter, Marieann Zipp, PA-C  naproxen (NAPROSYN) 500 MG tablet Take 1 tablet (500 mg total) by mouth 2 (two) times daily. Patient not taking: Reported on 04/18/2018 03/16/18   Janne NapoleonNeese, Hope M, NP  ondansetron (ZOFRAN ODT) 4 MG disintegrating tablet Take 1 tablet (4 mg total) by mouth every 8 (eight) hours as needed for nausea or vomiting. 04/18/18   Street, [place], PA-C  ranitidine (ZANTAC) 150 MG tablet Take 1 tablet (150 mg total) by mouth 2 (two) times daily. 04/18/18   Street, [place], PA-C    Family History Family History  Problem Relation Age of Onset   Healthy Mother    Healthy Father     Social History Social History   Tobacco Use   Smoking status: Current Every Day Smoker    Types: Cigars   Smokeless tobacco: Never Used  Substance Use Topics   Alcohol use: Never    Frequency: Never   Drug use: Never     Allergies   Patient has no known allergies.   Review of Systems Review of Systems  Constitutional: Negative for chills and fever.  Musculoskeletal: Positive for back pain. Negative for gait problem.     Physical Exam Updated Vital Signs BP 129/85 (BP Location: Right Arm)    Pulse 66    Temp 98.2 F (36.8 C) (Oral)    Resp 16    SpO2 100%  Physical Exam Vitals signs and nursing note reviewed.  Constitutional:      Appearance: He is not ill-appearing.  HENT:     Head: Normocephalic and atraumatic.  Eyes:     Conjunctiva/sclera: Conjunctivae normal.  Cardiovascular:     Rate and Rhythm: Normal rate and regular rhythm.  Pulmonary:     Effort: Pulmonary effort is normal.     Breath sounds: Normal breath sounds.  Musculoskeletal:     Comments: No C, T, or L spine midline tenderness to palpation. Mild left lumbar paraspinal TTP. Negative SLR bilaterally. Strength 5/5 with hip flexion, knee extension/flexion, plantarflexion and dorsiflexion. Sensation intact throughout to dull and sharp sensation. 2+ DP pulse  bilaterally.   Skin:    General: Skin is warm and dry.     Coloration: Skin is not jaundiced.  Neurological:     Mental Status: He is alert.      ED Treatments / Results  Labs (all labs ordered are listed, but only abnormal results are displayed) Labs Reviewed - No data to display  EKG None  Radiology No results found.  Procedures Procedures (including critical care time)  Medications Ordered in ED Medications - No data to display   Initial Impression / Assessment and Plan / ED Course  I have reviewed the triage vital signs and the nursing notes.  Pertinent labs & imaging results that were available during my care of the patient were reviewed by me and considered in my medical decision making (see chart for details).    Pt is an otherwise healthy 23 year old male who presents with left low back pain after moving heavy object today. No concerning symptoms including radiculopathy, fever, saddle anesthesia, urinary retention, urinary or bowel incontinence. Has had similar pain in the past, seen on 05/10 for same; given Mobic which he states helped his symptoms. Will prescribe same today; advised to follow up with PCP; resource given for [facility] if patient does not have PCP. Light duty note given for work. Strict return precautions discussed; patient is in agreement with plan at this time and stable for discharge home.       Final Clinical Impressions(s) / ED Diagnoses   Final diagnoses:  Muscle strain    ED Discharge Orders         Ordered    meloxicam (MOBIC) 15 MG tablet  Daily     08/06/18 2030           Eustaquio Maize, PA-C 08/07/18 0125    Daleen Bo, MD 08/07/18 2216

## 2018-08-06 NOTE — ED Notes (Signed)
Bed: WA01 Expected date:  Expected time:  Means of arrival:  Comments: 

## 2018-08-06 NOTE — Discharge Instructions (Signed)
You were seen in the ED today for back pain after lifting a heavy object; please take the medication as needed for your pain. You can follow up with [facility] for your primary care needs. Return to the ED for any worsening symptoms including fever, numbness in your groin, if you begin having difficulty urinating, or if you pee/poop on yourself.

## 2018-08-06 NOTE — ED Notes (Signed)
Bed: WTR5 Expected date:  Expected time:  Means of arrival:  Comments: 

## 2018-08-06 NOTE — ED Triage Notes (Signed)
Patient arrived iva POV.   Patient was helping his friend move something in his apartment and thinks he pulled a muscle in the left side of back.    Patient states he has done something similar to this before. Patient states back only hurts if he moves it a certain way.   6/10 pin point pain   A/ox4 Ambulatory in triage.

## 2018-08-31 ENCOUNTER — Other Ambulatory Visit: Payer: Self-pay

## 2018-08-31 ENCOUNTER — Emergency Department (HOSPITAL_COMMUNITY): Payer: Self-pay

## 2018-08-31 ENCOUNTER — Emergency Department (HOSPITAL_COMMUNITY)
Admission: EM | Admit: 2018-08-31 | Discharge: 2018-08-31 | Disposition: A | Discharge status: Home / Self Care | Payer: Self-pay | Attending: Emergency Medicine | Admitting: Emergency Medicine

## 2018-08-31 DIAGNOSIS — F17210 Nicotine dependence, cigarettes, uncomplicated: Secondary | ICD-10-CM | POA: Insufficient documentation

## 2018-08-31 DIAGNOSIS — W230XXA Caught, crushed, jammed, or pinched between moving objects, initial encounter: Secondary | ICD-10-CM | POA: Insufficient documentation

## 2018-08-31 DIAGNOSIS — Y9389 Activity, other specified: Secondary | ICD-10-CM | POA: Insufficient documentation

## 2018-08-31 DIAGNOSIS — Y999 Unspecified external cause status: Secondary | ICD-10-CM | POA: Insufficient documentation

## 2018-08-31 DIAGNOSIS — Y92810 Car as the place of occurrence of the external cause: Secondary | ICD-10-CM | POA: Insufficient documentation

## 2018-08-31 DIAGNOSIS — S61214A Laceration without foreign body of right ring finger without damage to nail, initial encounter: Secondary | ICD-10-CM | POA: Insufficient documentation

## 2018-08-31 NOTE — ED Triage Notes (Signed)
Pt reports he slammed his hand in the hood of his car and he started bleeding. Pt is alert and oriented

## 2018-08-31 NOTE — ED Provider Notes (Signed)
[facility] Department Provider Note MRN:  [id number]  Arrival date & time: 08/31/18     Chief Complaint   Hand Pain   History of Present Illness   Louis Carson is a 23 y.o. year-old male with no pertinent past medical history presenting to the ED with chief complaint of hand pain patient explains that last night he was at a gas station and closing his car door when his right ring finger got caught in the door, causing significant pain.  Explains that he had trouble stopping the bleeding throughout the night, has had a Band-Aid on it ever since.  Denies any other injuries, no head trauma, no loss consciousness.  Pain is currently mild, constant, worse with motion or palpation.  Review of Systems  A complete 10 system review of systems was obtained and all systems are negative except as noted in the HPI and PMH.   Patient's Health History   No past medical history on file.  No past surgical history on file.  Family History  Problem Relation Age of Onset  . Healthy Mother   . Healthy Father     Social History   Socioeconomic History  . Marital status: Single    Spouse name: Not on file  . Number of children: Not on file  . Years of education: Not on file  . Highest education level: Not on file  Occupational History  . Not on file  Social Needs  . Financial resource strain: Not on file  . Food insecurity    Worry: Not on file    Inability: Not on file  . Transportation needs    Medical: Not on file    Non-medical: Not on file  Tobacco Use  . Smoking status: Current Every Day Smoker    Types: Cigars  . Smokeless tobacco: Never Used  Substance and Sexual Activity  . Alcohol use: Never    Frequency: Never  . Drug use: Never  . Sexual activity: Not on file  Lifestyle  . Physical activity    Days per week: Not on file    Minutes per session: Not on file  . Stress: Not on file  Relationships  . Social Musicianconnections    Talks on phone: Not  on file    Gets together: Not on file    Attends religious service: Not on file    Active member of club or organization: Not on file    Attends meetings of clubs or organizations: Not on file    Relationship status: Not on file  . Intimate partner violence    Fear of current or ex partner: Not on file    Emotionally abused: Not on file    Physically abused: Not on file    Forced sexual activity: Not on file  Other Topics Concern  . Not on file  Social History Narrative  . Not on file     Physical Exam  Vital Signs and Nursing Notes reviewed Vitals:   08/31/18 1454  BP: 125/76  Pulse: 99  Resp: 16  Temp: 98 F (36.7 C)  SpO2: 100%    CONSTITUTIONAL: Well-appearing, NAD NEURO:  Alert and oriented x 3, no focal deficits EYES:  eyes equal and reactive ENT/NECK:  no LAD, no JVD CARDIO: Regular rate, well-perfused, normal S1 and S2 PULM:  CTAB no wheezing or rhonchi GI/GU:  normal bowel sounds, non-distended, non-tender MSK/SPINE:  No gross deformities, no edema SKIN: 1 cm laceration to the  volar aspect of the right ring finger PSYCH:  Appropriate speech and behavior  Diagnostic and Interventional Summary    Labs Reviewed - No data to display  DG Hand Complete Right  Final Result      Medications - No data to display   Procedures Critical Care  ED Course and Medical Decision Making  I have reviewed the triage vital signs and the nursing notes.  Pertinent labs & imaging results that were available during my care of the patient were reviewed by me and considered in my medical decision making (see below for details).  X-ray to exclude fracture, otherwise this is a laceration with a delayed presentation, laceration repair not indicated and would likely make things worse.  No signs of infection today.  Soaked in soapy water here for further cleaning.  Will allow to heal by secondary intention.  X-ray is unremarkable, appropriate for discharge.  After the discussed  management above, the patient was determined to be safe for discharge.  The patient was in agreement with this plan and all questions regarding their care were answered.  ED return precautions were discussed and the patient will return to the ED with any significant worsening of condition.  Barth Kirks. Sedonia Small, MD [place] [email or web address]  Final Clinical Impressions(s) / ED Diagnoses     ICD-10-CM   1. Laceration of right ring finger without foreign body without damage to nail, initial encounter  K74.259D     ED Discharge Orders    None         Maudie Flakes, MD 08/31/18 [phone]

## 2018-08-31 NOTE — Discharge Instructions (Addendum)
You were evaluated in the Emergency Department and after careful evaluation, we did not find any emergent condition requiring admission or further testing in the hospital.  Your symptoms today seem to be due to a laceration to the finger.  Your x-ray was normal and did not show any broken bones.  Please return to the Emergency Department if you experience any worsening of your condition.  We encourage you to follow up with a primary care provider.  Thank you for allowing [place] to be a part of your care.

## 2018-10-12 ENCOUNTER — Other Ambulatory Visit: Payer: Self-pay

## 2018-10-12 ENCOUNTER — Encounter (HOSPITAL_COMMUNITY): Payer: Self-pay

## 2018-10-12 ENCOUNTER — Emergency Department (HOSPITAL_COMMUNITY)
Admission: EM | Admit: 2018-10-12 | Discharge: 2018-10-12 | Disposition: A | Discharge status: Home / Self Care | Payer: Self-pay | Attending: Emergency Medicine | Admitting: Emergency Medicine

## 2018-10-12 DIAGNOSIS — R197 Diarrhea, unspecified: Secondary | ICD-10-CM | POA: Insufficient documentation

## 2018-10-12 DIAGNOSIS — R1084 Generalized abdominal pain: Secondary | ICD-10-CM | POA: Insufficient documentation

## 2018-10-12 DIAGNOSIS — Z5321 Procedure and treatment not carried out due to patient leaving prior to being seen by health care provider: Secondary | ICD-10-CM | POA: Insufficient documentation

## 2018-10-12 LAB — CBC
HCT: 43.7 % (ref 39.0–52.0)
Hemoglobin: 14.1 g/dL (ref 13.0–17.0)
MCH: 30 pg (ref 26.0–34.0)
MCHC: 32.3 g/dL (ref 30.0–36.0)
MCV: 93 fL (ref 80.0–100.0)
Platelets: 208 10*3/uL (ref 150–400)
RBC: 4.7 MIL/uL (ref 4.22–5.81)
RDW: 11.6 % (ref 11.5–15.5)
WBC: 4.5 10*3/uL (ref 4.0–10.5)
nRBC: 0 % (ref 0.0–0.2)

## 2018-10-12 LAB — COMPREHENSIVE METABOLIC PANEL
ALT: 18 U/L (ref 0–44)
AST: 18 U/L (ref 15–41)
Albumin: 4.2 g/dL (ref 3.5–5.0)
Alkaline Phosphatase: 57 U/L (ref 38–126)
Anion gap: 4 — ABNORMAL LOW (ref 5–15)
BUN: 13 mg/dL (ref 6–20)
CO2: 29 mmol/L (ref 22–32)
Calcium: 9.5 mg/dL (ref 8.9–10.3)
Chloride: 106 mmol/L (ref 98–111)
Creatinine, Ser: 0.95 mg/dL (ref 0.61–1.24)
GFR calc Af Amer: >60 mL/min (ref >60)
GFR calc non Af Amer: >60 mL/min (ref >60)
Glucose, Bld: 79 mg/dL (ref 70–99)
Potassium: 3.6 mmol/L (ref 3.5–5.1)
Sodium: 139 mmol/L (ref 135–145)
Total Bilirubin: 1.5 mg/dL — ABNORMAL HIGH (ref 0.3–1.2)
Total Protein: 7.3 g/dL (ref 6.5–8.1)

## 2018-10-12 LAB — LIPASE, BLOOD: Lipase: 47 U/L (ref 11–51)

## 2018-10-12 MED ORDER — SODIUM CHLORIDE 0.9% FLUSH: 3.0000 mL | Freq: Once | INTRAVENOUS | Status: DC

## 2018-10-12 NOTE — ED Triage Notes (Signed)
Pt states he thinks he ate something bad 2 days ago. Pt c/o emesis x 2 and diarrhea x 2. Generalized abd pain.

## 2020-05-08 IMAGING — CR LEFT KNEE - COMPLETE 4+ VIEW
4 series · 4 of 4 positions shown · non-contrast
Comparison: None.

CLINICAL DATA: Injury yesterday with anterior knee pain

EXAM:
LEFT KNEE - COMPLETE 4+ VIEW

[t knee ap left]
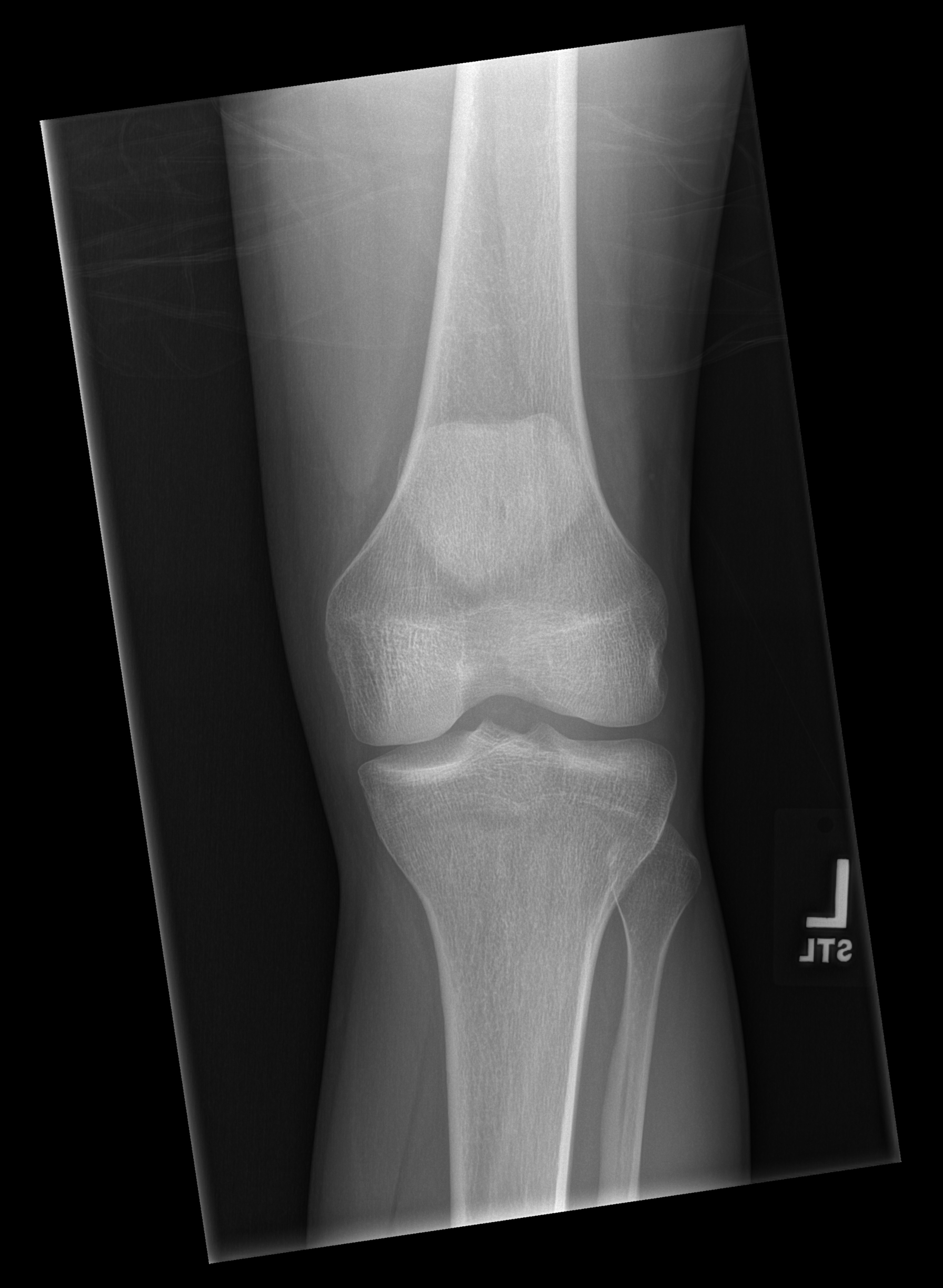

[t knee obl left (1 of 2)]
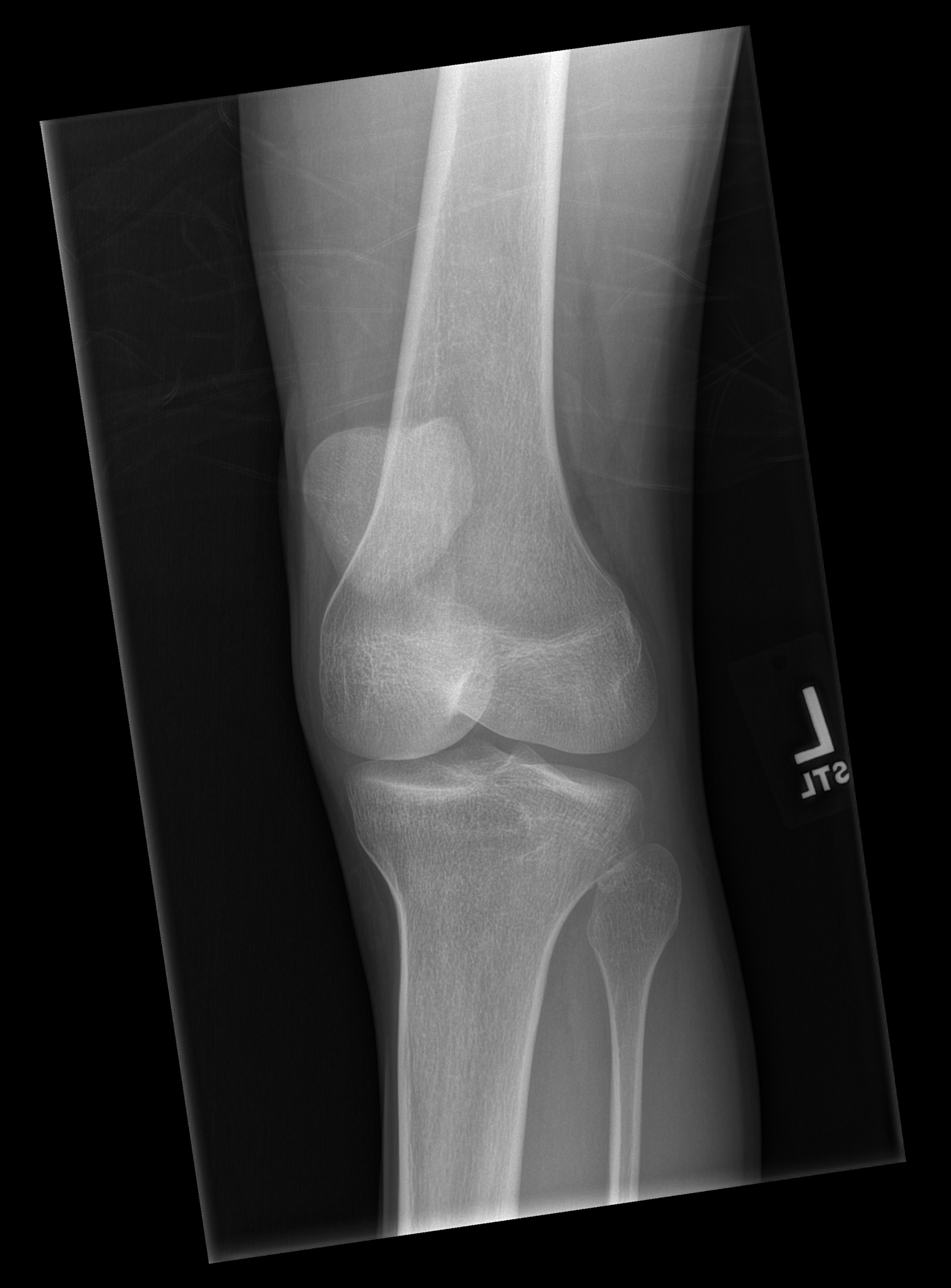

[t knee obl left (2 of 2)]
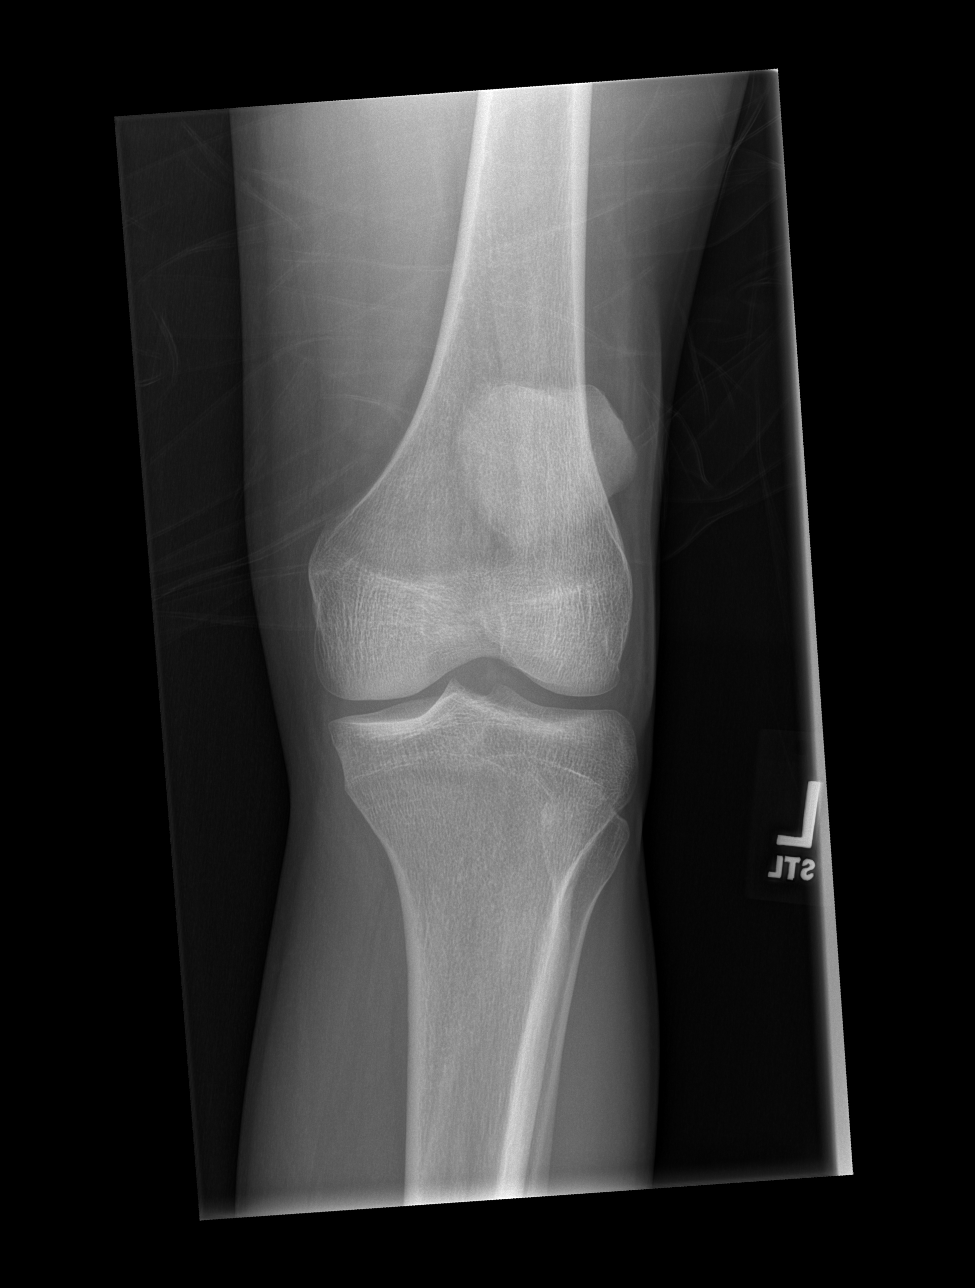

[t knee lat left]
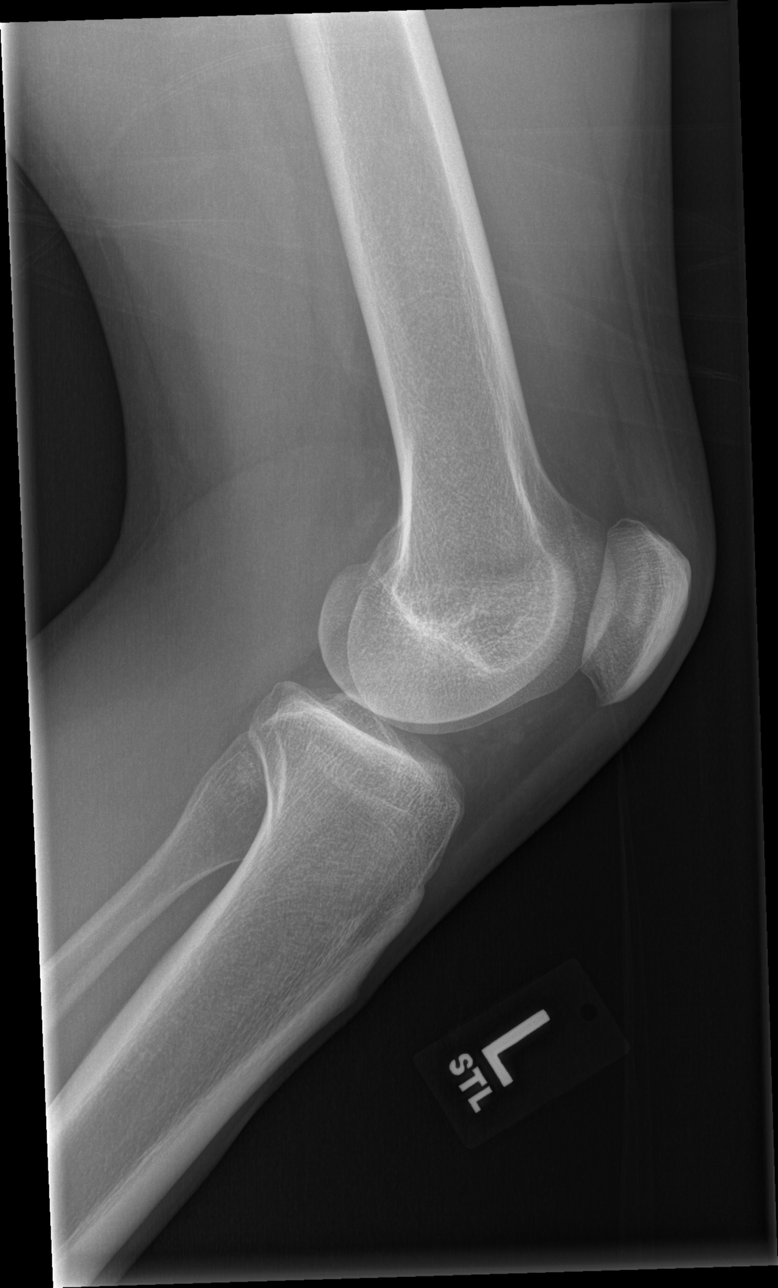

[4 of 4 positions shown; findings below may reference images not displayed]

FINDINGS: No evidence of fracture, dislocation, or joint effusion. No evidence
of arthropathy or other focal bone abnormality. Soft tissues are
unremarkable.
IMPRESSION: Normal radiographs.

## 2020-06-14 IMAGING — CR RIGHT HAND - COMPLETE 3+ VIEW
3 series · 3 of 3 positions shown · non-contrast
Comparison: None.

CLINICAL DATA: Shut hand in car door

EXAM:
RIGHT HAND - COMPLETE 3+ VIEW

[x hand pa right]
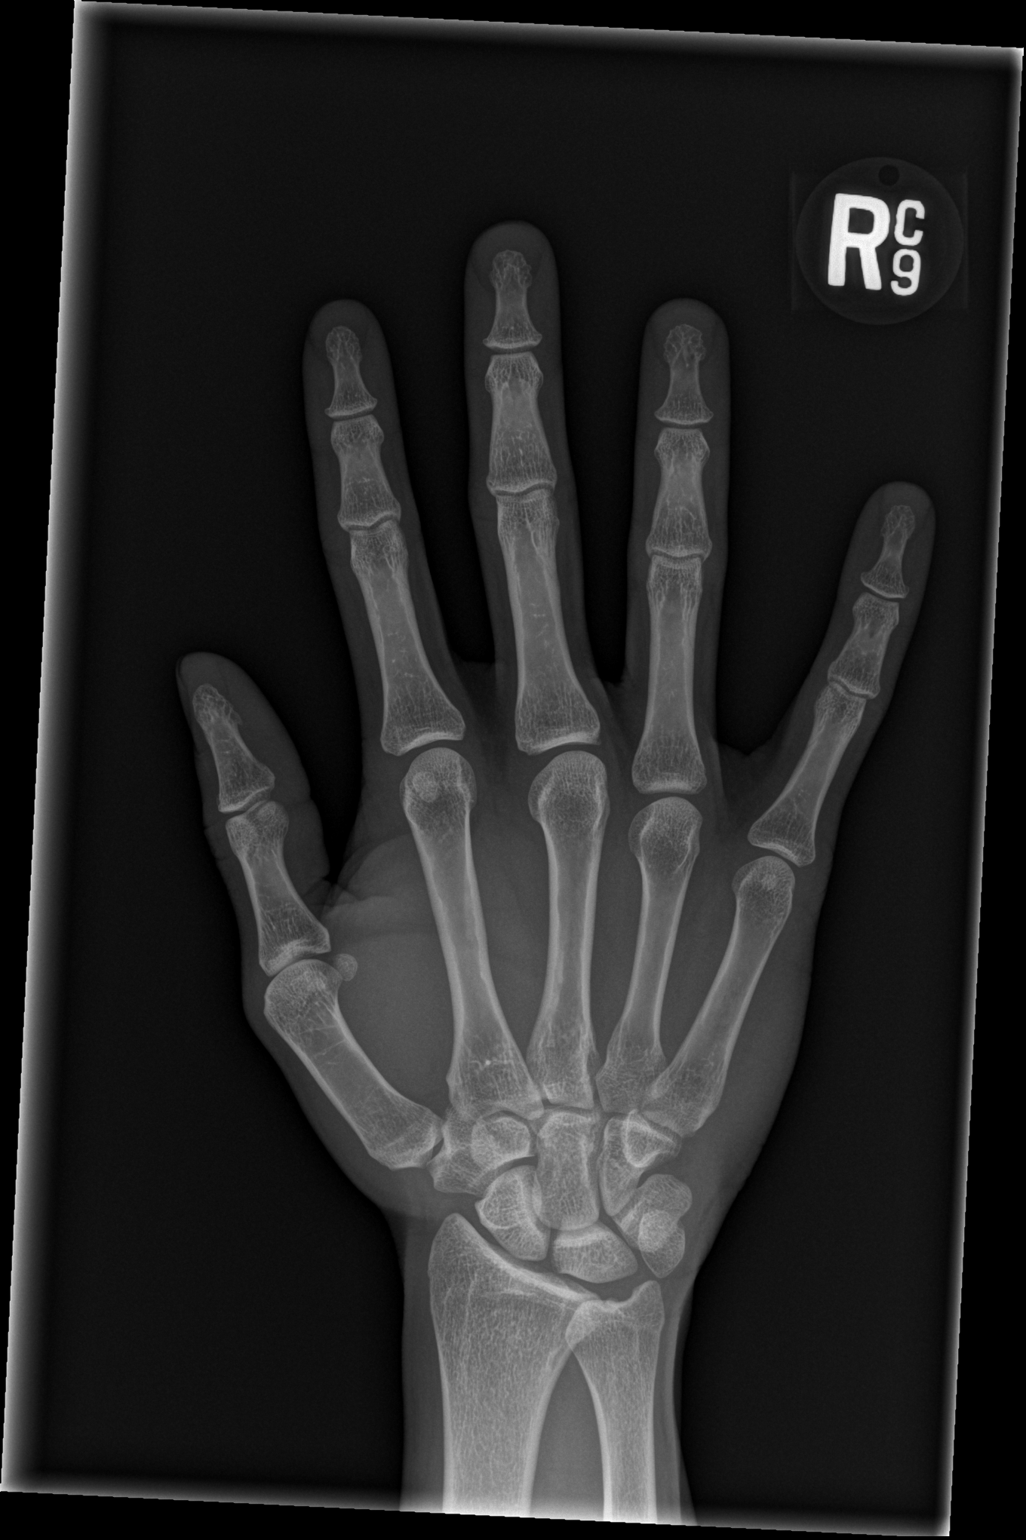

[x hand obl right]
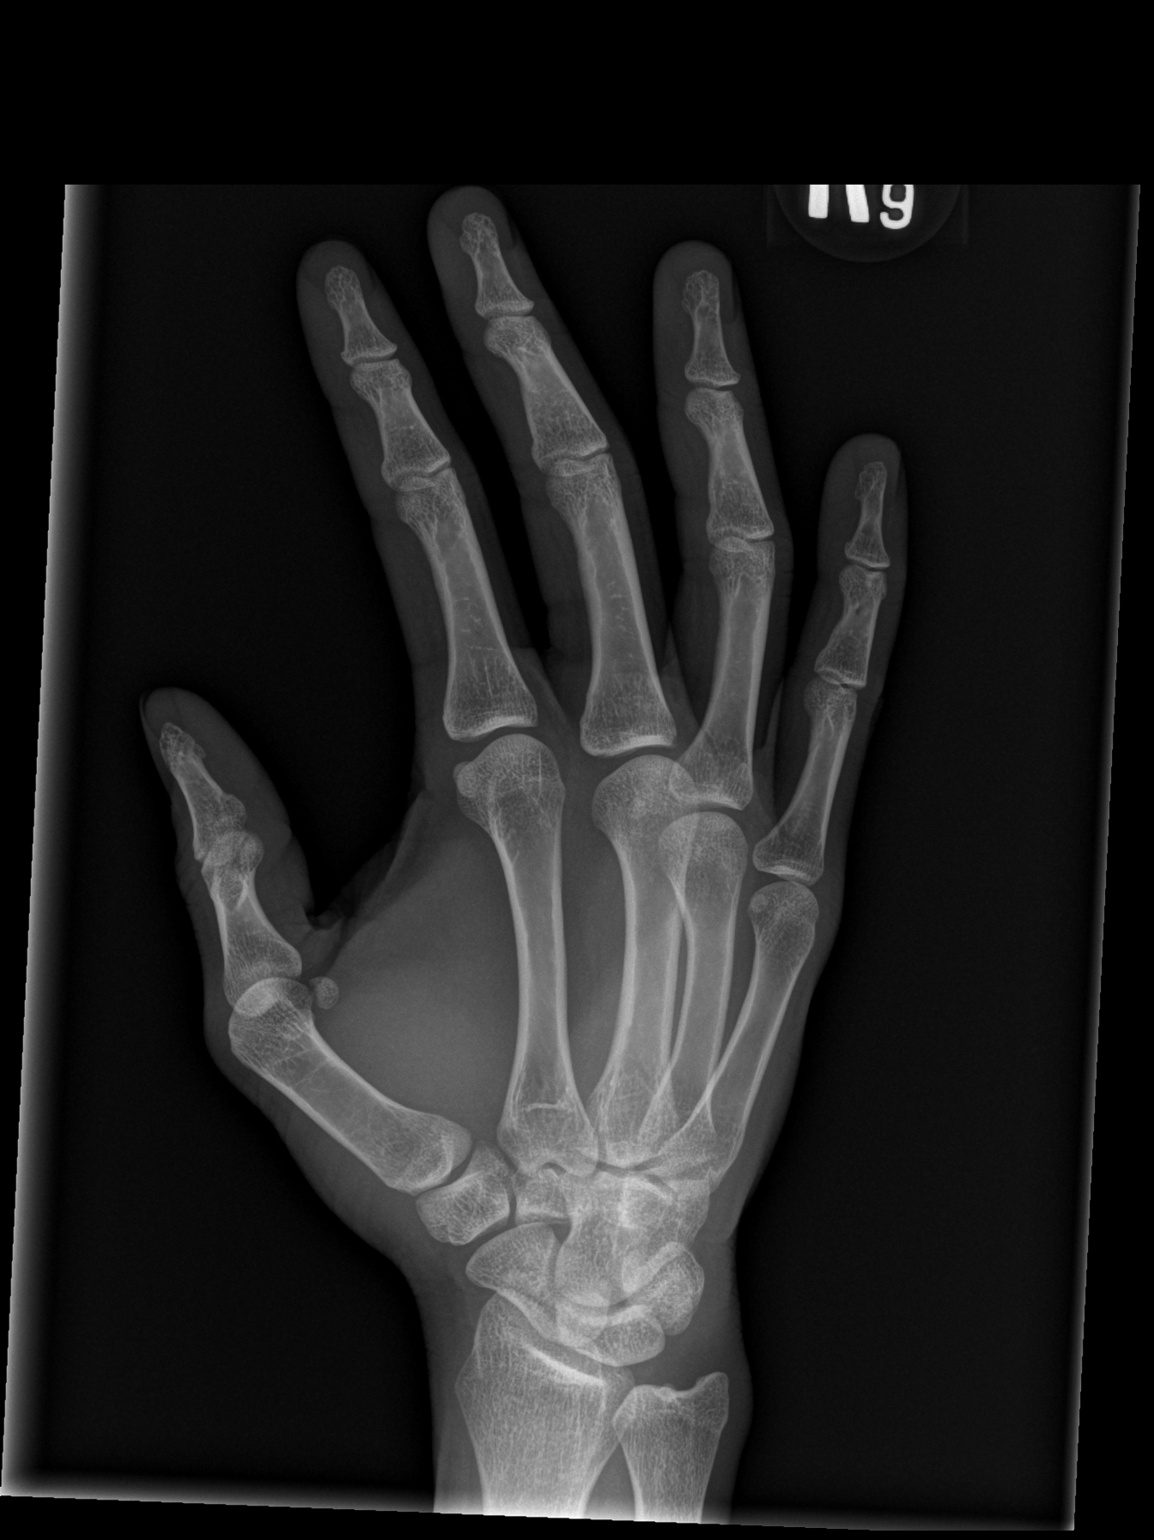

[x hand lat right]
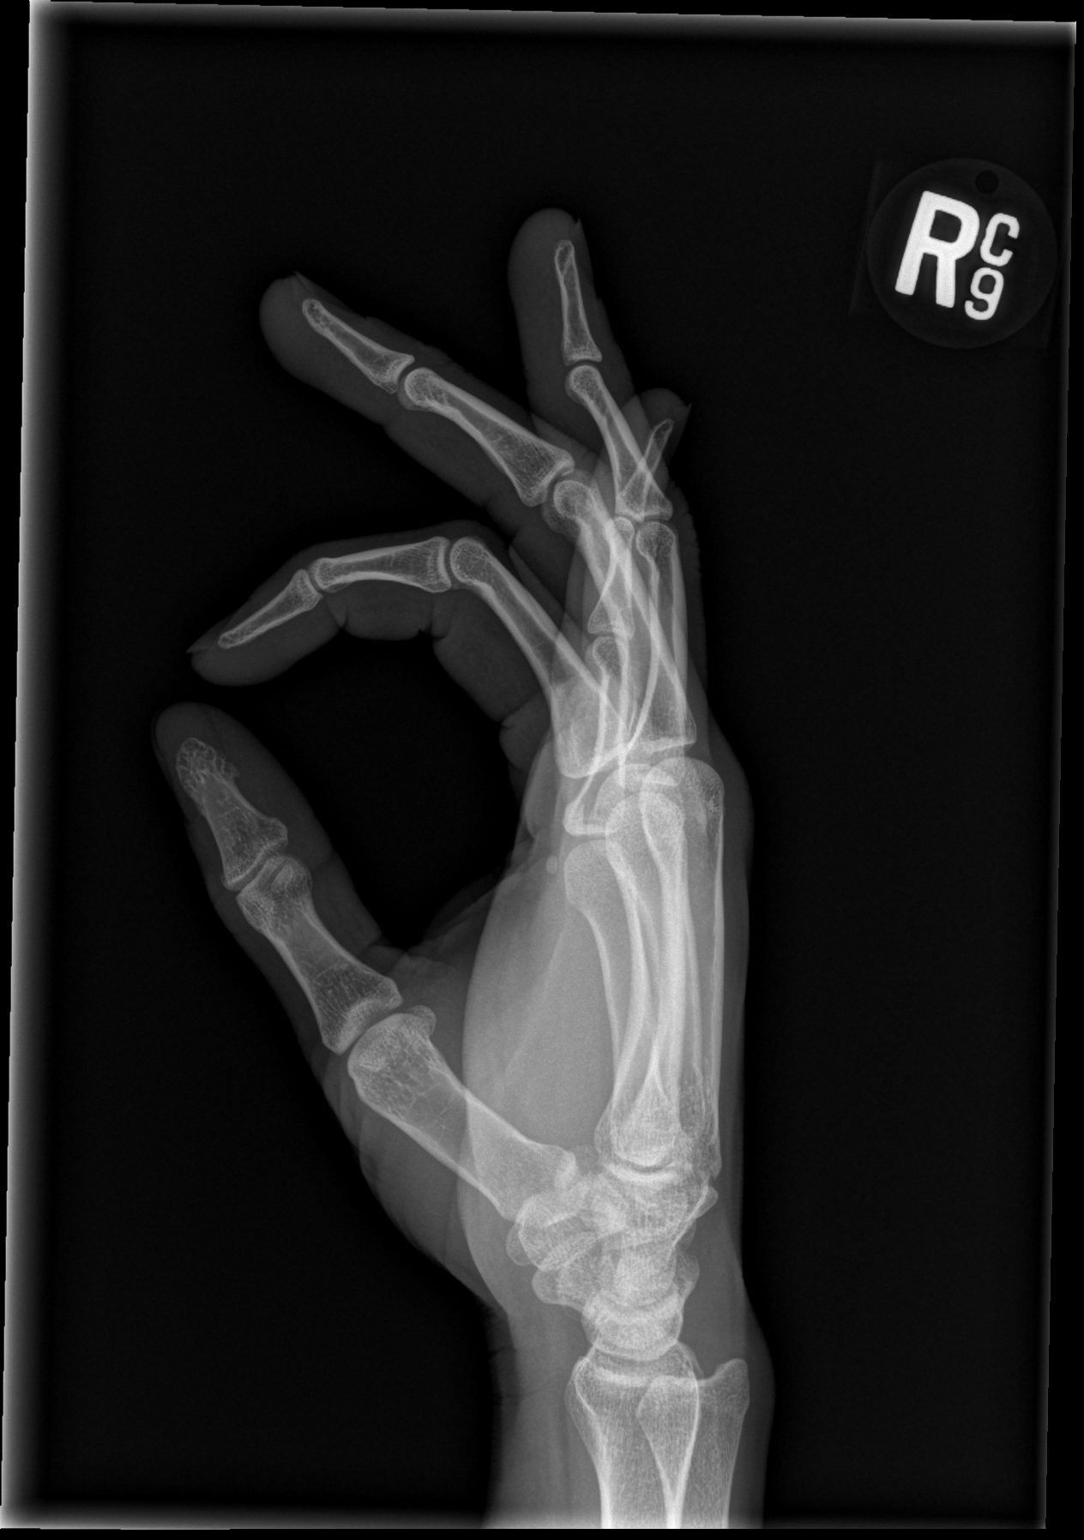

[3 of 3 positions shown; findings below may reference images not displayed]

FINDINGS: Frontal, oblique, and lateral views were obtained. No evident
fracture or dislocation. Joint spaces appear normal. No erosive
change.
IMPRESSION: No fracture or dislocation.  No evident arthropathy.

## 2021-05-06 ENCOUNTER — Emergency Department (HOSPITAL_COMMUNITY)
Admission: EM | Admit: 2021-05-06 | Discharge: 2021-05-06 | Disposition: A | Discharge status: Home / Self Care | Payer: Self-pay | Attending: Emergency Medicine | Admitting: Emergency Medicine

## 2021-05-06 ENCOUNTER — Encounter (HOSPITAL_COMMUNITY): Payer: Self-pay | Admitting: Student

## 2021-05-06 DIAGNOSIS — R197 Diarrhea, unspecified: Secondary | ICD-10-CM | POA: Insufficient documentation

## 2021-05-06 DIAGNOSIS — R109 Unspecified abdominal pain: Secondary | ICD-10-CM | POA: Insufficient documentation

## 2021-05-06 DIAGNOSIS — R112 Nausea with vomiting, unspecified: Secondary | ICD-10-CM

## 2021-05-06 LAB — COMPREHENSIVE METABOLIC PANEL
ALT: 21 U/L (ref 0–44)
AST: 25 U/L (ref 15–41)
Albumin: 4.2 g/dL (ref 3.5–5.0)
Alkaline Phosphatase: 63 U/L (ref 38–126)
Anion gap: 8 (ref 5–15)
BUN: 11 mg/dL (ref 6–20)
CO2: 27 mmol/L (ref 22–32)
Calcium: 9.3 mg/dL (ref 8.9–10.3)
Chloride: 107 mmol/L (ref 98–111)
Creatinine, Ser: 1.06 mg/dL (ref 0.61–1.24)
GFR, Estimated: >60 mL/min (ref >60)
Glucose, Bld: 88 mg/dL (ref 70–99)
Potassium: 3.8 mmol/L (ref 3.5–5.1)
Sodium: 142 mmol/L (ref 135–145)
Total Bilirubin: 1.3 mg/dL — ABNORMAL HIGH (ref 0.3–1.2)
Total Protein: 6.8 g/dL (ref 6.5–8.1)

## 2021-05-06 LAB — CBC
HCT: 45 % (ref 39.0–52.0)
Hemoglobin: 15 g/dL (ref 13.0–17.0)
MCH: 30.3 pg (ref 26.0–34.0)
MCHC: 33.3 g/dL (ref 30.0–36.0)
MCV: 90.9 fL (ref 80.0–100.0)
Platelets: 214 10*3/uL (ref 150–400)
RBC: 4.95 MIL/uL (ref 4.22–5.81)
RDW: 11.7 % (ref 11.5–15.5)
WBC: 4.7 10*3/uL (ref 4.0–10.5)
nRBC: 0 % (ref 0.0–0.2)

## 2021-05-06 MED ORDER — ONDANSETRON 4 MG PO TBDP: 4.0000 mg | ORAL_TABLET | Freq: Three times a day (TID) | ORAL | 0 refills | Status: AC | PRN

## 2021-05-06 MED ORDER — ONDANSETRON 4 MG PO TBDP
4.0000 mg | ORAL_TABLET | Freq: Once | ORAL | Status: AC | PRN
  Administered 2021-05-06: 4 mg via ORAL
  Filled 2021-05-06: qty 1

## 2021-05-06 NOTE — ED Notes (Signed)
Pt stated that he would not be waiting for his paperwork since he needs to leave to go pick someone up. Pt seen leaving ED.  ?

## 2021-05-06 NOTE — ED Triage Notes (Signed)
Pt states he thinks he has food poisoning r/t seafood dinner last night- crab legs & shrimp. States he has previous issues w same, "throwing up, feeling drowsy." States he stayed in bed all day, wasn't able to go to work, needs work note. ?

## 2021-05-06 NOTE — Discharge Instructions (Addendum)
You were seen in the ER today for vomiting and diarrhea.  ?Your labs were overall reassuring- your total bilirubin was mildly elevated but the same as your prior labs on record.  ? ?Please take zofran every 8 hours as needed for nausea/vomiting.  ? ?We have prescribed you new medication(s) today. Discuss the medications prescribed today with your pharmacist as they can have adverse effects and interactions with your other medicines including over the counter and prescribed medications. Seek medical evaluation if you start to experience new or abnormal symptoms after taking one of these medicines, seek care immediately if you start to experience difficulty breathing, feeling of your throat closing, facial swelling, or rash as these could be indications of a more serious allergic reaction ? ?Follow up with primary care within 1 week. Return to the Er for new or worsening symptoms including but not limited to new or worsening pain, inability to keep fluids down, blood in vomit or stool, or any other concerns.  ?

## 2021-05-06 NOTE — ED Notes (Signed)
Pt left prior to receiving discharge paperwork  

## 2021-05-06 NOTE — ED Provider Notes (Cosign Needed)
[facility] EMERGENCY DEPARTMENT Provider Note   CSN: [id number] Arrival date & time: 05/06/21  2029     History  Chief Complaint  Patient presents with   Nausea   note for work    Louis Carson is a 26 y.o. male with a history of tobacco use who presents to the ED requesting a note for work due to what he suspects was food poisoning. Patient states that in the middle of the night into this AM he had N/V/D, multiple episodes of gagging/emesis and diarrhea with some abdominal discomfort. He thinks these sxs were due to some seafood he had last night. He is feeling much better now. He denies current pain. Denies fever, hematemesis, melena, hematochezia, dysuria, chest pain, dyspnea, or cough. No recent abx/foreign travel.   HPI     Home Medications Prior to Admission medications   Medication Sig Start Date End Date Taking? Authorizing Provider  amoxicillin (AMOXIL) 500 MG capsule Take 1 capsule (500 mg total) by mouth 3 (three) times daily. Patient not taking: Reported on 04/18/2018 04/10/18   Elson Areas, PA-C  cyclobenzaprine (FLEXERIL) 10 MG tablet Take 1 tablet (10 mg total) by mouth 2 (two) times daily as needed for muscle spasms. Patient not taking: Reported on 04/18/2018 03/16/18   Janne Napoleon, NP  DM-Phenylephrine-Acetaminophen [facility]) 10-5-325 MG/15ML LIQD Take 15 mLs by mouth every 6 (six) hours as needed (cough, headache, and congestion).    [provider]  meloxicam (MOBIC) 15 MG tablet Take 1 tablet (15 mg total) by mouth daily as needed for pain. 07/05/18   Raeford Razor, MD  naproxen (NAPROSYN) 500 MG tablet Take 1 tablet (500 mg total) by mouth 2 (two) times daily. Patient not taking: Reported on 04/18/2018 03/16/18   Janne Napoleon, NP  ondansetron (ZOFRAN ODT) 4 MG disintegrating tablet Take 1 tablet (4 mg total) by mouth every 8 (eight) hours as needed for nausea or vomiting. 04/18/18   Street, [place], PA-C  ranitidine  (ZANTAC) 150 MG tablet Take 1 tablet (150 mg total) by mouth 2 (two) times daily. 04/18/18   Street, [place], PA-C      Allergies    Patient has no known allergies.    Review of Systems   Review of Systems  Constitutional:  Negative for chills and fever.  Respiratory:  Negative for cough and shortness of breath.   Cardiovascular:  Negative for chest pain.  Gastrointestinal:  Positive for abdominal pain, diarrhea, nausea and vomiting. Negative for blood in stool.  Genitourinary:  Negative for dysuria.  Neurological:  Negative for syncope.  All other systems reviewed and are negative.  Physical Exam Updated Vital Signs BP 125/71 (BP Location: Left Arm)    Pulse 65    Temp 98.5 F (36.9 C) (Oral)    Resp (!) 22    SpO2 100%  Physical Exam Vitals and nursing note reviewed.  Constitutional:      General: He is not in acute distress.    Appearance: He is well-developed. He is not toxic-appearing.  HENT:     Head: Normocephalic and atraumatic.  Eyes:     General:        Right eye: No discharge.        Left eye: No discharge.     Conjunctiva/sclera: Conjunctivae normal.  Cardiovascular:     Rate and Rhythm: Normal rate and regular rhythm.  Pulmonary:     Effort: No respiratory distress.     Breath  sounds: Normal breath sounds. No wheezing or rales.  Abdominal:     General: There is no distension.     Palpations: Abdomen is soft.     Tenderness: There is no abdominal tenderness. There is no guarding or rebound.  Musculoskeletal:     Cervical back: Neck supple.  Skin:    General: Skin is warm and dry.  Neurological:     Mental Status: He is alert.     Comments: Clear speech.   Psychiatric:        Behavior: Behavior normal.    ED Results / Procedures / Treatments   Labs (all labs ordered are listed, but only abnormal results are displayed) Labs Reviewed  COMPREHENSIVE METABOLIC PANEL - Abnormal; Notable for the following components:      Result Value   Total Bilirubin  1.3 (*)    All other components within normal limits  CBC    EKG None  Radiology No results found.  Procedures Procedures    Medications Ordered in ED Medications  ondansetron (ZOFRAN-ODT) disintegrating tablet 4 mg (4 mg Oral Given 05/06/21 2056)    ED Course/ Medical Decision Making/ A&P                           Medical Decision Making Amount and/or Complexity of Data Reviewed Labs: ordered.  Risk Prescription drug management.   Patient presents to the ED with complaints of N/V/D w/ abdominal pain that is resolved currently, this involves an extensive number of treatment options, and is a complaint that carries with it a high risk of complications and morbidity. Nontoxic, vitals fairly unremarkable.   Additional history obtained:  Chart & nursing note reviewed.   Lab Tests:  I reviewed & interpreted labs including:  CBC: Unremarkable.  CMP: mild T bili elevation- no significant change from prior labs. Renal function preserved, no critical electrolyte derangement.   ED Course:  Patient given zofran in triage, tolerating PO. Abdomen is nontender without peritoneal signs- doubt perf, obstruction, appendicitis, or other acute surgical process. Patient appears appropriate for discharge at this time. I discussed results, treatment plan, need for follow-up, and return precautions with the patient. Provided opportunity for questions, patient confirmed understanding and is in agreement with plan.   Based on patient's chief complaint, I considered admission might be necessary, however after reassuring ED workup feel patient is reasonable for discharge.   Portions of this note were generated with Scientist, clinical (histocompatibility and immunogenetics). Dictation errors may occur despite best attempts at proofreading.  Final Clinical Impression(s) / ED Diagnoses Final diagnoses:  Nausea vomiting and diarrhea    Rx / DC Orders ED Discharge Orders          Ordered    ondansetron (ZOFRAN-ODT) 4 MG  disintegrating tablet  Every 8 hours PRN        05/06/21 2229              Yakov Bergen, Pleas Koch, PA-C 05/06/21 2259

## 2023-02-08 ENCOUNTER — Other Ambulatory Visit: Payer: Self-pay

## 2023-02-08 ENCOUNTER — Encounter (HOSPITAL_COMMUNITY): Payer: Self-pay

## 2023-02-08 ENCOUNTER — Inpatient Hospital Stay (HOSPITAL_COMMUNITY)
Admission: RE | Admit: 2023-02-08 | Discharge: 2023-02-08 | Discharge status: Home / Self Care | Payer: Self-pay | Source: Ambulatory Visit | Attending: Emergency Medicine | Admitting: Emergency Medicine

## 2023-02-08 VITALS — BP 130/79 | HR 73 | Temp 98.2°F | Resp 18

## 2023-02-08 DIAGNOSIS — Z202 Contact with and (suspected) exposure to infections with a predominantly sexual mode of transmission: Secondary | ICD-10-CM | POA: Insufficient documentation

## 2023-02-08 DIAGNOSIS — R369 Urethral discharge, unspecified: Secondary | ICD-10-CM | POA: Insufficient documentation

## 2023-02-08 MED ORDER — DOXYCYCLINE HYCLATE 100 MG PO CAPS: 100.0000 mg | ORAL_CAPSULE | Freq: Two times a day (BID) | ORAL | 0 refills | Status: AC

## 2023-02-08 NOTE — ED Triage Notes (Signed)
Pt requesting to be check for STD.

## 2023-02-08 NOTE — Discharge Instructions (Addendum)
We will call you if testing shows sti that is not covered by antibiotic given

## 2023-02-08 NOTE — ED Provider Notes (Incomplete)
[facility]    CSN: [id number] Arrival date & time: 02/08/23  1249      History   Chief Complaint Chief Complaint  Patient presents with   Exposure to STD    HPI Louis Carson is a 27 y.o. male.   Patient presenting for STI testing.  He is reporting a whitish penile discharge.  He has had multiple sexual partners.  Sexual partners are not reporting any symptoms at this time.  The history is provided by the patient.  Exposure to STD    No past medical history on file.  There are no active problems to display for this patient.   No past surgical history on file.     Home Medications    Prior to Admission medications   Medication Sig Start Date End Date Taking? Authorizing Provider  DM-Phenylephrine-Acetaminophen (THERAFLU EXPRESSMAX) 10-5-325 MG/15ML LIQD Take 15 mLs by mouth every 6 (six) hours as needed (cough, headache, and congestion).    [provider]  meloxicam (MOBIC) 15 MG tablet Take 1 tablet (15 mg total) by mouth daily as needed for pain. 07/05/18   Raeford Razor, MD  ondansetron (ZOFRAN-ODT) 4 MG disintegrating tablet Take 1 tablet (4 mg total) by mouth every 8 (eight) hours as needed for nausea or vomiting. 05/06/21   Petrucelli, Lelon Mast R, PA-C  ranitidine (ZANTAC) 150 MG tablet Take 1 tablet (150 mg total) by mouth 2 (two) times daily. 04/18/18   Street, [place], PA-C    Family History Family History  Problem Relation Age of Onset   Healthy Mother    Healthy Father     Social History Social History   Tobacco Use   Smoking status: Every Day    Types: Cigars   Smokeless tobacco: Never  Vaping Use   Vaping status: Never Used  Substance Use Topics   Alcohol use: Never   Drug use: Never     Allergies   Patient has no known allergies.   Review of Systems Review of Systems  Genitourinary:  Positive for penile discharge.     Physical Exam Triage Vital Signs ED Triage Vitals  Encounter Vitals Group     BP  02/08/23 1323 130/79     Systolic BP Percentile --      Diastolic BP Percentile --      Pulse Rate 02/08/23 1323 73     Resp 02/08/23 1323 18     Temp 02/08/23 1323 98.2 F (36.8 C)     Temp Source 02/08/23 1323 Oral     SpO2 02/08/23 1323 99 %     Weight --      Height --      Head Circumference --      Peak Flow --      Pain Score 02/08/23 1324 0     Pain Loc --      Pain Education --      Exclude from Growth Chart --    No data found.  Updated Vital Signs BP 130/79 (BP Location: Right Arm)   Pulse 73   Temp 98.2 F (36.8 C) (Oral)   Resp 18   SpO2 99%   Visual Acuity Right Eye Distance:   Left Eye Distance:   Bilateral Distance:    Right Eye Near:   Left Eye Near:    Bilateral Near:     Physical Exam Genitourinary:    Penis: Normal.      Testes: Normal.      UC Treatments /  Results  Labs (all labs ordered are listed, but only abnormal results are displayed) Labs Reviewed - No data to display  EKG   Radiology No results found.  Procedures Procedures (including critical care time)  Medications Ordered in UC Medications - No data to display  Initial Impression / Assessment and Plan / UC Course  I have reviewed the triage vital signs and the nursing notes.  Pertinent labs & imaging results that were available during my care of the patient were reviewed by me and considered in my medical decision making (see chart for details).   Swabbed for possible STI.  Treatment with doxycycline d/t multiple partners.     Final Clinical Impressions(s) / UC Diagnoses   Final diagnoses:  None   Discharge Instructions   None    ED Prescriptions   None    PDMP not reviewed this encounter.

## 2023-02-10 LAB — CYTOLOGY, (ORAL, ANAL, URETHRAL) ANCILLARY ONLY
Chlamydia: POSITIVE — AB
Comment: NEGATIVE
Comment: NEGATIVE
Comment: NORMAL
Neisseria Gonorrhea: NEGATIVE
Trichomonas: NEGATIVE
